# Patient Record
Sex: Female | Born: 1980 | Race: White | Hispanic: No | Marital: Married | Smoking: Never smoker
Health system: Southern US, Community
[De-identification: ages and names within clinical notes are randomized; demographics above are authoritative.]

## PROBLEM LIST (undated history)

## (undated) DIAGNOSIS — O09529 Supervision of elderly multigravida, unspecified trimester: Secondary | ICD-10-CM

## (undated) DIAGNOSIS — Z789 Other specified health status: Secondary | ICD-10-CM

## (undated) DIAGNOSIS — Z87440 Personal history of urinary (tract) infections: Secondary | ICD-10-CM

## (undated) DIAGNOSIS — N979 Female infertility, unspecified: Secondary | ICD-10-CM

## (undated) DIAGNOSIS — Z92241 Personal history of systemic steroid therapy: Secondary | ICD-10-CM

## (undated) DIAGNOSIS — Z8619 Personal history of other infectious and parasitic diseases: Secondary | ICD-10-CM

## (undated) HISTORY — DX: Personal history of systemic steroid therapy: Z92.241

## (undated) HISTORY — DX: Supervision of elderly multigravida, unspecified trimester: O09.529

## (undated) HISTORY — DX: Personal history of other infectious and parasitic diseases: Z86.19

## (undated) HISTORY — DX: Personal history of urinary (tract) infections: Z87.440

## (undated) HISTORY — DX: Female infertility, unspecified: N97.9

## (undated) HISTORY — PX: WISDOM TOOTH EXTRACTION: SHX21

---
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
## 2000-05-20 HISTORY — PX: TONSILLECTOMY: SUR1361

## 2005-05-20 HISTORY — PX: OTHER SURGICAL HISTORY: SHX169

## 2006-01-22 ENCOUNTER — Emergency Department (HOSPITAL_COMMUNITY): Admission: EM | Admit: 2006-01-22 | Discharge: 2006-01-22 | Payer: Self-pay | Admitting: Emergency Medicine

## 2006-01-26 ENCOUNTER — Ambulatory Visit (HOSPITAL_COMMUNITY): Admission: RE | Admit: 2006-01-26 | Discharge: 2006-01-26 | Payer: Self-pay | Admitting: Family Medicine

## 2006-01-26 ENCOUNTER — Emergency Department (HOSPITAL_COMMUNITY): Admission: EM | Admit: 2006-01-26 | Discharge: 2006-01-26 | Payer: Self-pay | Admitting: Family Medicine

## 2006-02-05 ENCOUNTER — Encounter: Admission: RE | Admit: 2006-02-05 | Discharge: 2006-02-05 | Payer: Self-pay | Admitting: Neurosurgery

## 2006-02-26 ENCOUNTER — Other Ambulatory Visit: Admission: RE | Admit: 2006-02-26 | Discharge: 2006-02-26 | Payer: Self-pay | Admitting: Obstetrics and Gynecology

## 2006-02-27 ENCOUNTER — Encounter: Admission: RE | Admit: 2006-02-27 | Discharge: 2006-02-27 | Payer: Self-pay | Admitting: Neurosurgery

## 2006-03-20 ENCOUNTER — Encounter: Admission: RE | Admit: 2006-03-20 | Discharge: 2006-03-20 | Payer: Self-pay | Admitting: Neurosurgery

## 2010-02-09 ENCOUNTER — Ambulatory Visit (HOSPITAL_COMMUNITY): Admission: RE | Admit: 2010-02-09 | Discharge: 2010-02-09 | Payer: Self-pay | Admitting: Obstetrics and Gynecology

## 2010-12-12 LAB — RPR: RPR: NONREACTIVE

## 2010-12-12 LAB — RUBELLA ANTIBODY, IGM: Rubella: IMMUNE

## 2010-12-12 LAB — HIV ANTIBODY (ROUTINE TESTING W REFLEX): HIV: NONREACTIVE

## 2010-12-12 LAB — ABO/RH: RH Type: POSITIVE

## 2010-12-12 LAB — GC/CHLAMYDIA PROBE AMP, GENITAL: Gonorrhea: NEGATIVE

## 2011-05-21 NOTE — L&D Delivery Note (Signed)
Delivery Note At 4:01 PM a viable female was delivered via Vaginal, Spontaneous Delivery, pushing on R side, easy delivery of the head, (Presentation: Left Occiput Anterior), loose nuchal cord x 1 reduced, easy delivery of the shoulders, baby to pt abd, cord doubly clamped and cut by FOB,.  APGAR: 7, 8; weight 7 lb 4.8 oz (3310 g).   Placenta status: Intact, schultze accessory lobe, Spontaneous.  Cord: 3 vessels.  Anesthesia: Epidural  Episiotomy: None Lacerations: None Suture Repair: none Est. Blood Loss (mL):   Mom to postpartum.  Baby to rooming in.  Leafy Motsinger 07/27/2011, 5:56 PM

## 2011-06-21 ENCOUNTER — Inpatient Hospital Stay (HOSPITAL_COMMUNITY): Admission: AD | Admit: 2011-06-21 | Payer: Self-pay | Source: Ambulatory Visit | Admitting: Obstetrics and Gynecology

## 2011-07-16 ENCOUNTER — Encounter: Payer: Commercial Managed Care - PPO | Admitting: Obstetrics and Gynecology

## 2011-07-16 ENCOUNTER — Other Ambulatory Visit (INDEPENDENT_AMBULATORY_CARE_PROVIDER_SITE_OTHER): Payer: Commercial Managed Care - PPO

## 2011-07-16 DIAGNOSIS — O0900 Supervision of pregnancy with history of infertility, unspecified trimester: Secondary | ICD-10-CM

## 2011-07-17 ENCOUNTER — Encounter (HOSPITAL_COMMUNITY): Payer: Self-pay | Admitting: *Deleted

## 2011-07-17 ENCOUNTER — Telehealth (HOSPITAL_COMMUNITY): Payer: Self-pay | Admitting: *Deleted

## 2011-07-17 NOTE — Telephone Encounter (Signed)
Preadmission screen  

## 2011-07-25 ENCOUNTER — Encounter (INDEPENDENT_AMBULATORY_CARE_PROVIDER_SITE_OTHER): Payer: 59 | Admitting: Obstetrics and Gynecology

## 2011-07-25 DIAGNOSIS — O368190 Decreased fetal movements, unspecified trimester, not applicable or unspecified: Secondary | ICD-10-CM

## 2011-07-26 ENCOUNTER — Encounter (HOSPITAL_COMMUNITY): Payer: Self-pay

## 2011-07-26 ENCOUNTER — Inpatient Hospital Stay (HOSPITAL_COMMUNITY)
Admission: RE | Admit: 2011-07-26 | Discharge: 2011-07-29 | DRG: 775 | Disposition: A | Discharge status: Home / Self Care | Payer: 59 | Source: Ambulatory Visit | Attending: Obstetrics and Gynecology | Admitting: Obstetrics and Gynecology

## 2011-07-26 DIAGNOSIS — O480 Post-term pregnancy: Principal | ICD-10-CM | POA: Diagnosis present

## 2011-07-26 HISTORY — DX: Other specified health status: Z78.9

## 2011-07-26 LAB — CBC
HCT: 37.9 % (ref 36.0–46.0)
MCHC: 33.5 g/dL (ref 30.0–36.0)
MCV: 87.1 fL (ref 78.0–100.0)
Platelets: 178 10*3/uL (ref 150–400)
RDW: 13.4 % (ref 11.5–15.5)

## 2011-07-26 MED ORDER — TERBUTALINE SULFATE 1 MG/ML IJ SOLN: 0.2500 mg | Freq: Once | INTRAMUSCULAR | Status: AC | PRN

## 2011-07-26 MED ORDER — LIDOCAINE HCL (PF) 1 % IJ SOLN: 30.0000 mL | INTRAMUSCULAR | Status: DC | PRN

## 2011-07-26 MED ORDER — IBUPROFEN 600 MG PO TABS: 600.0000 mg | ORAL_TABLET | Freq: Four times a day (QID) | ORAL | Status: DC | PRN

## 2011-07-26 MED ORDER — OXYTOCIN BOLUS FROM INFUSION
500.0000 mL | Freq: Once | INTRAVENOUS | Status: DC
  Filled 2011-07-26: qty 500

## 2011-07-26 MED ORDER — ACETAMINOPHEN 325 MG PO TABS: 650.0000 mg | ORAL_TABLET | ORAL | Status: DC | PRN

## 2011-07-26 MED ORDER — OXYTOCIN 20 UNITS IN LACTATED RINGERS INFUSION - SIMPLE: 125.0000 mL/h | Freq: Once | INTRAVENOUS | Status: DC

## 2011-07-26 MED ORDER — CITRIC ACID-SODIUM CITRATE 334-500 MG/5ML PO SOLN: 30.0000 mL | ORAL | Status: DC | PRN

## 2011-07-26 MED ORDER — LACTATED RINGERS IV SOLN: 500.0000 mL | INTRAVENOUS | Status: DC | PRN

## 2011-07-26 MED ORDER — LACTATED RINGERS IV SOLN
INTRAVENOUS | Status: DC
  Administered 2011-07-26 – 2011-07-27 (×3): via INTRAVENOUS

## 2011-07-26 MED ORDER — ONDANSETRON HCL 4 MG/2ML IJ SOLN: 4.0000 mg | Freq: Four times a day (QID) | INTRAMUSCULAR | Status: DC | PRN

## 2011-07-26 MED ORDER — SODIUM CHLORIDE 0.9 % IJ SOLN: 3.0000 mL | Freq: Two times a day (BID) | INTRAMUSCULAR | Status: DC

## 2011-07-26 MED ORDER — OXYCODONE-ACETAMINOPHEN 5-325 MG PO TABS
1.0000 | ORAL_TABLET | ORAL | Status: DC | PRN
Start: 2011-07-26 — End: 2011-07-27

## 2011-07-26 MED ORDER — OXYTOCIN 10 UNIT/ML IJ SOLN: 10.0000 [IU] | Freq: Once | INTRAMUSCULAR | Status: DC

## 2011-07-26 MED ORDER — MISOPROSTOL 25 MCG QUARTER TABLET
25.0000 ug | ORAL_TABLET | ORAL | Status: DC | PRN
  Administered 2011-07-26 – 2011-07-27 (×2): 25 ug via VAGINAL
  Filled 2011-07-26 (×2): qty 0.25

## 2011-07-26 MED ORDER — ZOLPIDEM TARTRATE 10 MG PO TABS
10.0000 mg | ORAL_TABLET | Freq: Every evening | ORAL | Status: DC | PRN
  Administered 2011-07-26: 10 mg via ORAL
  Filled 2011-07-26: qty 1

## 2011-07-27 ENCOUNTER — Inpatient Hospital Stay (HOSPITAL_COMMUNITY): Payer: 59 | Admitting: Anesthesiology

## 2011-07-27 ENCOUNTER — Encounter (HOSPITAL_COMMUNITY): Payer: Self-pay

## 2011-07-27 ENCOUNTER — Encounter (HOSPITAL_COMMUNITY): Payer: Self-pay | Admitting: Anesthesiology

## 2011-07-27 MED ORDER — OXYTOCIN 20 UNITS IN LACTATED RINGERS INFUSION - SIMPLE
1.0000 m[IU]/min | INTRAVENOUS | Status: DC
  Administered 2011-07-27: 1 m[IU]/min via INTRAVENOUS
  Filled 2011-07-27: qty 1000

## 2011-07-27 MED ORDER — BENZOCAINE-MENTHOL 20-0.5 % EX AERO
INHALATION_SPRAY | CUTANEOUS | Status: AC
  Filled 2011-07-27: qty 56

## 2011-07-27 MED ORDER — ZOLPIDEM TARTRATE 5 MG PO TABS: 5.0000 mg | ORAL_TABLET | Freq: Every evening | ORAL | Status: DC | PRN

## 2011-07-27 MED ORDER — SENNOSIDES-DOCUSATE SODIUM 8.6-50 MG PO TABS
2.0000 | ORAL_TABLET | Freq: Every day | ORAL | Status: DC
  Administered 2011-07-27 – 2011-07-28 (×2): 2 via ORAL

## 2011-07-27 MED ORDER — WITCH HAZEL-GLYCERIN EX PADS: 1.0000 "application " | MEDICATED_PAD | CUTANEOUS | Status: DC | PRN

## 2011-07-27 MED ORDER — PHENYLEPHRINE 40 MCG/ML (10ML) SYRINGE FOR IV PUSH (FOR BLOOD PRESSURE SUPPORT)
80.0000 ug | PREFILLED_SYRINGE | INTRAVENOUS | Status: DC | PRN
  Filled 2011-07-27 (×2): qty 5

## 2011-07-27 MED ORDER — EPHEDRINE 5 MG/ML INJ
10.0000 mg | INTRAVENOUS | Status: DC | PRN
  Filled 2011-07-27: qty 4

## 2011-07-27 MED ORDER — FENTANYL 2.5 MCG/ML BUPIVACAINE 1/10 % EPIDURAL INFUSION (WH - ANES)
14.0000 mL/h | INTRAMUSCULAR | Status: DC
  Filled 2011-07-27: qty 60

## 2011-07-27 MED ORDER — BUTORPHANOL TARTRATE 2 MG/ML IJ SOLN: 2.0000 mg | INTRAMUSCULAR | Status: DC | PRN

## 2011-07-27 MED ORDER — LANOLIN HYDROUS EX OINT: TOPICAL_OINTMENT | CUTANEOUS | Status: DC | PRN

## 2011-07-27 MED ORDER — PHENYLEPHRINE 40 MCG/ML (10ML) SYRINGE FOR IV PUSH (FOR BLOOD PRESSURE SUPPORT): 80.0000 ug | PREFILLED_SYRINGE | INTRAVENOUS | Status: DC | PRN

## 2011-07-27 MED ORDER — DIPHENHYDRAMINE HCL 50 MG/ML IJ SOLN: 12.5000 mg | INTRAMUSCULAR | Status: DC | PRN

## 2011-07-27 MED ORDER — CALCIUM CARBONATE ANTACID 500 MG PO CHEW: 2.0000 | CHEWABLE_TABLET | ORAL | Status: DC | PRN

## 2011-07-27 MED ORDER — DIPHENHYDRAMINE HCL 25 MG PO CAPS: 25.0000 mg | ORAL_CAPSULE | Freq: Four times a day (QID) | ORAL | Status: DC | PRN

## 2011-07-27 MED ORDER — ONDANSETRON HCL 4 MG/2ML IJ SOLN: 4.0000 mg | INTRAMUSCULAR | Status: DC | PRN

## 2011-07-27 MED ORDER — TERBUTALINE SULFATE 1 MG/ML IJ SOLN: 0.2500 mg | Freq: Once | INTRAMUSCULAR | Status: DC | PRN

## 2011-07-27 MED ORDER — SODIUM BICARBONATE 8.4 % IV SOLN
INTRAVENOUS | Status: DC | PRN
  Administered 2011-07-27: 5 mL via EPIDURAL

## 2011-07-27 MED ORDER — SIMETHICONE 80 MG PO CHEW: 80.0000 mg | CHEWABLE_TABLET | ORAL | Status: DC | PRN

## 2011-07-27 MED ORDER — DIBUCAINE 1 % RE OINT: 1.0000 "application " | TOPICAL_OINTMENT | RECTAL | Status: DC | PRN

## 2011-07-27 MED ORDER — FENTANYL 2.5 MCG/ML BUPIVACAINE 1/10 % EPIDURAL INFUSION (WH - ANES)
INTRAMUSCULAR | Status: DC | PRN
  Administered 2011-07-27: 14 mL/h via EPIDURAL

## 2011-07-27 MED ORDER — OXYCODONE-ACETAMINOPHEN 5-325 MG PO TABS: 1.0000 | ORAL_TABLET | ORAL | Status: DC | PRN

## 2011-07-27 MED ORDER — BENZOCAINE-MENTHOL 20-0.5 % EX AERO: 1.0000 "application " | INHALATION_SPRAY | CUTANEOUS | Status: DC | PRN

## 2011-07-27 MED ORDER — PROMETHAZINE HCL 25 MG/ML IJ SOLN: 12.5000 mg | INTRAMUSCULAR | Status: DC | PRN

## 2011-07-27 MED ORDER — EPHEDRINE 5 MG/ML INJ: 10.0000 mg | INTRAVENOUS | Status: DC | PRN

## 2011-07-27 MED ORDER — LACTATED RINGERS IV SOLN: 500.0000 mL | Freq: Once | INTRAVENOUS | Status: DC

## 2011-07-27 MED ORDER — IBUPROFEN 600 MG PO TABS
600.0000 mg | ORAL_TABLET | Freq: Four times a day (QID) | ORAL | Status: DC
  Administered 2011-07-27 – 2011-07-29 (×8): 600 mg via ORAL
  Filled 2011-07-27 (×8): qty 1

## 2011-07-27 MED ORDER — TETANUS-DIPHTH-ACELL PERTUSSIS 5-2.5-18.5 LF-MCG/0.5 IM SUSP: 0.5000 mL | Freq: Once | INTRAMUSCULAR | Status: DC

## 2011-07-27 MED ORDER — PRENATAL MULTIVITAMIN CH
1.0000 | ORAL_TABLET | Freq: Every day | ORAL | Status: DC
  Administered 2011-07-28 – 2011-07-29 (×2): 1 via ORAL
  Filled 2011-07-27: qty 1

## 2011-07-27 MED ORDER — ONDANSETRON HCL 4 MG PO TABS: 4.0000 mg | ORAL_TABLET | ORAL | Status: DC | PRN

## 2011-07-27 NOTE — Progress Notes (Addendum)
Patient ID: Ann Roberts, female   DOB: 24-Mar-1981, 31 y.o.   MRN: [id number] Slept some, agrees to foley bulb and pitocin O VSS      Fhts 140s LTV min to mod      uc q 2 mild      abd soft between uc     Vag 1 60 -2 VTX I A 41 week induction P continue care, discussed options for pain relief, Lavera Guise, CNM Addendum: Foley bulb placed easily, plan pitocin when able, Dr. Estanislado Pandy updated at 0830, [facility]

## 2011-07-27 NOTE — Progress Notes (Signed)
Dr. Estanislado Pandy updated on pt status per telephone, continue care. Lavera Guise, CNM

## 2011-07-27 NOTE — H&P (Signed)
Ann Roberts is a 31 y.o. female presenting for IOL secondary to PD. PT denies ctx, VB, LOF, GFM. Pregnancy significant for: 1. 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 Persistent glucosuria - nl GDM testing  HPI: pt began [facility] at [facility] at 10wks. She had normal 1st trimester screen, and AFP. Normal anatomy scan. She a normal 1hr gtt at 19wks and a repeat at 28wks that was normal. She had persistent glucosuria, with normal CBG's. [place] at 39wks EFW 7-15. GBS at 36wks was neg.   Maternal Medical History:  Reason for admission: IOL for PD  Fetal activity: Perceived fetal activity is normal.   Last perceived fetal movement was within the past hour.    Prenatal complications: no prenatal complications   OB History    Grav Para Term Preterm Abortions TAB SAB Ect Mult Living   1              Past Medical History  Diagnosis Date  . Infertility, female   . History of cystitis   . No pertinent past medical history    Past Surgical History  Procedure Date  . Anterior cruciate ligament repair 1996    R  . Tonsillectomy   . Wisdom tooth extraction   . Epidural steroids     x2 for ruptured disc in back  . Tonsilectomy, adenoidectomy, bilateral myringotomy and tubes    Family History: family history includes Cancer in her maternal grandfather; Dementia in her paternal grandmother; Diabetes in her maternal grandmother; Factor V Leiden deficiency in her paternal aunt; Heart attack in her maternal grandmother; Hypertension in her paternal aunt; and Migraines in her father. Social History:  reports that she has never smoked. She has never used smokeless tobacco. She reports that she does not drink alcohol or use illicit drugs.  Review of Systems  All other systems reviewed and are negative.    Dilation: 1 Effacement (%): 50 Station: -2 Exam by:: Cammy Copa, RN Blood pressure 115/67, pulse 89, temperature 98.3 F (36.8 C), temperature source Oral, resp. rate 16,  height 5\' 8"  (1.727 m), weight 85.73 kg (189 lb), last menstrual period 10/12/2010, SpO2 96.00%. Maternal Exam:  Uterine Assessment: Contraction strength is mild.  Contraction frequency is rare.   Abdomen: Patient reports no abdominal tenderness. Fundal height is aga.   Estimated fetal weight is 8#.   Fetal presentation: vertex  Introitus: Normal vulva. Normal vagina.  Vagina is negative for discharge.  Pelvis: adequate for delivery.   Cervix: Cervix evaluated by digital exam.     Fetal Exam Fetal Monitor Review: Mode: ultrasound.   Baseline rate: 140.  Variability: moderate (6-25 bpm).   Pattern: accelerations present and no decelerations.    Fetal State Assessment: Category I - tracings are normal.     Physical Exam  Nursing note and vitals reviewed. Constitutional: She is oriented to person, place, and time. She appears well-developed and well-nourished. No distress.  HENT:  Head: Normocephalic.  Neck: Normal range of motion.  Cardiovascular: Normal rate, regular rhythm and normal heart sounds.   Respiratory: Effort normal and breath sounds normal.  GI: Soft.  Genitourinary: Vagina normal. No vaginal discharge found.  Musculoskeletal: Normal range of motion. She exhibits no edema.  Neurological: She is alert and oriented to person, place, and time. She has normal reflexes.  Skin: Skin is warm and dry.  Psychiatric: She has a normal mood and affect. Her behavior is normal.    Prenatal labs: ABO, Rh:  O/Positive/-- (07/25 0000) Antibody: Negative (07/25 0000) Rubella: Immune (07/25 0000) RPR: NON REACTIVE (03/08 2240)  HBsAg: Negative (07/25 0000)  HIV: Non-reactive (07/25 0000)  GBS: Negative (01/31 0000)  GC/CT neg Normal 1st trimester screen and AFP Normal early 1hr gtt and 28wk gtt  Assessment/Plan: IUP at 41wks GBS neg FHR reassuring Desires IOL  Admit to birthing suites Dr Estanislado Pandy attending Routine labor orders cytotec for cervical ripening Will  evaluate for pitocin in the AM   Lurline Caver M 07/27/2011, 4:30 AM

## 2011-07-27 NOTE — Anesthesia Postprocedure Evaluation (Signed)
  Anesthesia Post-op Note  Patient: Ann Roberts  Procedure(s) Performed: * No procedures listed *  Patient Location: PACU and Mother/Baby  Anesthesia Type: Epidural  Level of Consciousness: awake, alert  and oriented  Airway and Oxygen Therapy: Patient Spontanous Breathing   Post-op Assessment: Patient's Cardiovascular Status Stable and Respiratory Function Stable  Post-op Vital Signs: stable  Complications: No apparent anesthesia complications

## 2011-07-27 NOTE — Progress Notes (Signed)
Breathing well with uc O Fhts 130s LTV mod accels     uc a 1-3 mild     Vag not assessed     IV pit at 3 A 41 week induction P continue care Lavera Guise, CNM

## 2011-07-27 NOTE — Anesthesia Preprocedure Evaluation (Signed)

## 2011-07-27 NOTE — Progress Notes (Signed)
Tense with uc, ready for epidural O VSS     Fhts 130s LTV min variables with rapid recovery     uc q 2-4 mod     abd soft between uc     Vag 6 100 -1/- VTX R clear fluid A active labor P continue care, epidural now Lavera Guise, CNM

## 2011-07-27 NOTE — Anesthesia Procedure Notes (Signed)

## 2011-07-28 LAB — CBC
HCT: 33.8 % — ABNORMAL LOW (ref 36.0–46.0)
Hemoglobin: 11.3 g/dL — ABNORMAL LOW (ref 12.0–15.0)
MCHC: 33.4 g/dL (ref 30.0–36.0)
MCV: 88.7 fL (ref 78.0–100.0)
RDW: 13.1 % (ref 11.5–15.5)
WBC: 14 10*3/uL — ABNORMAL HIGH (ref 4.0–10.5)

## 2011-07-28 NOTE — Progress Notes (Signed)
Comfortable, no sleep, no pain, breastfed well this am, little bleeding O VSS     Ff sm rubra flow perineum clearn, intact     No edema, - Homan-s sign bilaterally, no edema A pp day 1 lactating, normal involution P continue care Lavera Guise, CNM

## 2011-07-29 MED ORDER — NORETHINDRONE 0.35 MG PO TABS: 1.0000 | ORAL_TABLET | Freq: Every day | ORAL | Status: DC

## 2011-07-29 MED ORDER — IBUPROFEN 600 MG PO TABS: 600.0000 mg | ORAL_TABLET | Freq: Four times a day (QID) | ORAL | Status: AC | PRN

## 2011-07-29 NOTE — Discharge Instructions (Signed)
Vaginal Delivery Care After  Change your pad on each trip to the bathroom.   Wipe gently with toilet paper during your hospital stay. Always wipe from front to back. A spray bottle with warm tap water could also be used or a towelette if available.   Place your soiled pad and toilet paper in a bathroom wastebasket with a plastic bag liner.   During your hospital stay, save any clots. If you pass a clot while on the toilet, do not flush it. Also, if your vaginal flow seems excessive to you, notify nursing personnel.   The first time you get out of bed after delivery, wait for assistance from a nurse. Do not get up alone at any time if you feel weak or dizzy.   Bend and extend your ankles forcefully so that you feel the calves of your legs get hard. Do this 6 times every hour when you are in bed and awake.   Do not sit with one foot under you, dangle your legs over the edge of the bed, or maintain a position that hinders the circulation in your legs.   Many women experience after pains for 2 to 3 days after delivery. These after pains are mild uterine contractions. Ask the nurse for a pain medication if you need something for this. Sometimes breastfeeding stimulates after pains; if you find this to be true, ask for the medication  -  hour before the next feeding.   For you and your infant's protection, do not go beyond the door(s) of the obstetric unit. Do not carry your baby in your arms in the hallway. When taking your baby to and from your room, put your baby in the bassinet and push the bassinet.   Mothers may have their babies in their room as much as they desire.   Breastfeeding BENEFITS OF BREASTFEEDING For the baby  The first milk (colostrum) helps the baby's digestive system function better.   There are antibodies from the mother in the milk that help the baby fight off infections.   The baby has a lower incidence of asthma, allergies, and SIDS (sudden infant death syndrome).     The nutrients in breast milk are better than formulas for the baby and helps the baby's brain grow better.   Babies who breastfeed have less gas, colic, and constipation.  For the mother  Breastfeeding helps develop a very special bond between mother and baby.   It is more convenient, always available at the correct temperature and cheaper than formula feeding.   It burns calories in the mother and helps with losing weight that was gained during pregnancy.   It makes the uterus contract back down to normal size faster and slows bleeding following delivery.   Breastfeeding mothers have a lower risk of developing breast cancer.  NURSE FREQUENTLY  A healthy, full-term baby may breastfeed as often as every hour or space his or her feedings to every 3 hours.   How often to nurse will vary from baby to baby. Watch your baby for signs of hunger, not the clock.   Nurse as often as the baby requests, or when you feel the need to reduce the fullness of your breasts.   Awaken the baby if it has been 3 to 4 hours since the last feeding.   Frequent feeding will help the mother make more milk and will prevent problems like sore nipples and engorgement of the breasts.  BABY'S POSITION AT THE BREAST    Whether lying down or sitting, be sure that the baby's tummy is facing your tummy.   Support the breast with 4 fingers underneath the breast and the thumb above. Make sure your fingers are well away from the nipple and baby's mouth.   Stroke the baby's lips and cheek closest to the breast gently with your finger or nipple.   When the baby's mouth is open wide enough, place all of your nipple and as much of the dark area around the nipple as possible into your baby's mouth.   Pull the baby in close so the tip of the nose and the baby's cheeks touch the breast during the feeding.  FEEDINGS  The length of each feeding varies from baby to baby and from feeding to feeding.   The baby must suck  about 2 to 3 minutes for your milk to get to him or her. This is called a "let down." For this reason, allow the baby to feed on each breast as long as he or she wants. Your baby will end the feeding when he or she has received the right balance of nutrients.   To break the suction, put your finger into the corner of the baby's mouth and slide it between his or her gums before removing your breast from his or her mouth. This will help prevent sore nipples.  REDUCING BREAST ENGORGEMENT  In the first week after your baby is born, you may experience signs of breast engorgement. When breasts are engorged, they feel heavy, warm, full, and may be tender to the touch. You can reduce engorgement if you:   Nurse frequently, every 2 to 3 hours. Mothers who breastfeed early and often have fewer problems with engorgement.   Place light ice packs on your breasts between feedings. This reduces swelling. Wrap the ice packs in a lightweight towel to protect your skin.   Apply moist hot packs to your breast for 5 to 10 minutes before each feeding. This increases circulation and helps the milk flow.   Gently massage your breast before and during the feeding.   Make sure that the baby empties at least one breast at every feeding before switching sides.   Use a breast pump to empty the breasts if your baby is sleepy or not nursing well. You may also want to pump if you are returning to work or or you feel you are getting engorged.   Avoid bottle feeds, pacifiers or supplemental feedings of water or juice in place of breastfeeding.   Be sure the baby is latched on and positioned properly while breastfeeding.   Prevent fatigue, stress, and anemia.   Wear a supportive bra, avoiding underwire styles.   Eat a balanced diet with enough fluids.  If you follow these suggestions, your engorgement should improve in 24 to 48 hours. If you are still experiencing difficulty, call your lactation consultant or  caregiver. IS MY BABY GETTING ENOUGH MILK? Sometimes, mothers worry about whether their babies are getting enough milk. You can be assured that your baby is getting enough milk if:  The baby is actively sucking and you hear swallowing.   The baby nurses at least 8 to 12 times in a 24 hour time period. Nurse your baby until he or she unlatches or falls asleep at the first breast (at least 10 to 20 minutes), then offer the second side.   The baby is wetting 5 to 6 disposable diapers (6 to 8 cloth diapers) in a   24 hour period by 5 to 6 days of age.   The baby is having at least 2 to 3 stools every 24 hours for the first few months. Breast milk is all the food your baby needs. It is not necessary for your baby to have water or formula. In fact, to help your breasts make more milk, it is best not to give your baby supplemental feedings during the early weeks.   The stool should be soft and yellow.   The baby should gain 4 to 7 ounces per week after he is 4 days old.  TAKE CARE OF YOURSELF Take care of your breasts by:  Bathing or showering daily.   Avoiding the use of soaps on your nipples.   Start feedings on your left breast at one feeding and on your right breast at the next feeding.   You will notice an increase in your milk supply 2 to 5 days after delivery. You may feel some discomfort from engorgement, which makes your breasts very firm and often tender. Engorgement "peaks" out within 24 to 48 hours. In the meantime, apply warm moist towels to your breasts for 5 to 10 minutes before feeding. Gentle massage and expression of some milk before feeding will soften your breasts, making it easier for your baby to latch on. Wear a well fitting nursing bra and air dry your nipples for 10 to 15 minutes after each feeding.   Only use cotton bra pads.   Only use pure lanolin on your nipples after nursing. You do not need to wash it off before nursing.  Take care of yourself by:   Eating  well-balanced meals and nutritious snacks.   Drinking milk, fruit juice, and water to satisfy your thirst (about 8 glasses a day).   Getting plenty of rest.   Increasing calcium in your diet (1200 mg a day).   Avoiding foods that you notice affect the baby in a bad way.  SEEK MEDICAL CARE IF:   You have any questions or difficulty with breastfeeding.   You need help.   You have a hard, red, sore area on your breast, accompanied by a fever of 100.5 F (38.1 C) or more.   Your baby is too sleepy to eat well or is having trouble sleeping.   Your baby is wetting less than 6 diapers per day, by 5 days of age.   Your baby's skin or white part of his or her eyes is more yellow than it was in the hospital.   You feel depressed.  Document Released: 05/06/2005 Document Revised: 04/25/2011 Document Reviewed: 12/19/2008 ExitCare Patient Information 2012 [employer]. 

## 2011-07-29 NOTE — Discharge Summary (Signed)
  Obstetric Discharge Summary Reason for Admission: induction of labor Prenatal Procedures: ultrasound Intrapartum Procedures: spontaneous vaginal delivery Postpartum Procedures: none Complications-Operative and Postpartum: none  Temp:  [98.3 F (36.8 C)-98.5 F (36.9 C)] 98.3 F (36.8 C) (03/11 0621) Pulse Rate:  [73-81] 81  (03/11 0621) Resp:  [18] 18  (03/11 0621) BP: (113-138)/(75-86) 113/75 mmHg (03/11 0621) Hemoglobin  Date Value Range Status  07/28/2011 11.3* 12.0-15.0 (g/dL) Final     HCT  Date Value Range Status  07/28/2011 33.8* 36.0-46.0 (%) Final   S: Doing well. Denies complaints. Breastfeeding going well. Lactation in to assist. Desires Micronor as BCM   O: VSS  Assessment/Plan General: alert and oriented Lochia: scant rubra, Fundus firm Homan's sign negative- No signs of DVT Advised PP exam appt. X 6 weeks (has appt.) Begin Micronor 4 weeks on 08/25/2011   Hospital Course:  Hospital Course: Admitted in for induction of labor for postdates. Negative GBS. Progressed to fully dilated. Delivery was performed by Maryln Manuel, CNM without difficulty. Patient and baby tolerated the procedure without difficulty, without a laceration noted. Infant to FTN. Mother and infant then had an uncomplicated postpartum course, with  breastfeeding going well. Mom's physical exam was WNL, and she was discharged home in stable condition. Contraception plan was Micronor.  She received adequate benefit from po pain medications.  Discharge Diagnoses: Post-date pregnancy  Discharge Information: Date: 07/29/2011 Activity: nothing in vagina x 6 weeks Diet: routine Medications:  Medication List  As of 07/29/2011  8:57 AM   ASK your doctor about these medications         calcium carbonate 500 MG chewable tablet   Commonly known as: TUMS - dosed in mg elemental calcium      prenatal multivitamin Tabs           Condition: stable Instructions: refer to practice specific  booklet Discharge to: home   Newborn Data: Live born  Information for the patient's newborn:  Rayah, Fines [[id number]]  female ; APGAR 7,8 ; weight 7 lb. 4.8 oz.    Kizzie Fantasia CORI 07/29/2011, 8:57 AM   Agree with above - AYR

## 2011-07-31 ENCOUNTER — Encounter (HOSPITAL_COMMUNITY): Payer: 59

## 2011-09-06 ENCOUNTER — Encounter: Payer: Self-pay | Admitting: Obstetrics and Gynecology

## 2011-09-06 ENCOUNTER — Ambulatory Visit (INDEPENDENT_AMBULATORY_CARE_PROVIDER_SITE_OTHER): Payer: 59 | Admitting: Obstetrics and Gynecology

## 2011-09-06 NOTE — Progress Notes (Signed)
Ann Roberts  is 6 weeks postpartum following a spontaneous vaginal delivery at 23 gestational weeks Date: 08/03/11 female baby named Ann Roberts deceased at 2 days, cause unknown.    Post-partum blues / depression:  no   History of abnormal Pap:  no  Last Pap: Date  Sept 2012 Gestational diabetes:  no  Contraception:  Desires no method  Normal urinary function:  yes Normal GI function:  yes Returning to work:  Yes 09/09/11  Subjective:     Ann Roberts is a 31 y.o. female who presents for a postpartum visit. Nolan died at 5 days of age. Grieving appropriately. Declines meds. Awaiting autopsy. I have fully reviewed the prenatal and intrapartum course.    Patient is sexually active.   The following portions of the patient's history were reviewed and updated as appropriate: allergies, current medications, past family history, past medical history, past social history, past surgical history and problem list.  Review of Systems Pertinent items are noted in HPI.   Objective:    BP 110/82  Wt 167 lb (75.751 kg)  Breastfeeding? No  General:  alert, cooperative and no distress     Lungs: clear to auscultation bilaterally  Heart:  regular rate and rhythm, S1, S2 normal, no murmur  Abdomen: soft, non-tender; bowel sounds normal; no masses,  no organomegaly   Vulva:  normal  Vagina: normal vagina  Cervix:  normal  Corpus: normal size, contour, position, consistency, mobility, non-tender  Adnexa:  normal adnexa             Assessment:     Normal postpartum exam.  Unexplained neonatal death Pap smear not done at today's visit.   Plan:     1. Contraception: none 3. Follow up in: 5 months  AEX or as needed.    Reizel Calzada A MD 09/06/2011 1:32 PM

## 2011-09-16 ENCOUNTER — Telehealth: Payer: Self-pay | Admitting: Obstetrics and Gynecology

## 2011-09-16 NOTE — Telephone Encounter (Signed)
Routed to Laura/Dr. Rivard/3rd call

## 2011-09-17 ENCOUNTER — Telehealth: Payer: Self-pay | Admitting: Obstetrics and Gynecology

## 2011-09-17 NOTE — Telephone Encounter (Signed)
TC to [place] at Dr [employer] office. Next available appointment is 03/10/12 at 9:30.  Dr SR will be made aware.

## 2011-09-19 NOTE — Telephone Encounter (Signed)
TC to pt. Informed of appt for genetic counselor.  Phone number given to check on cancellation for earlier appt.   Pt states will research other genetic counseling possibilities and call if desires referral to different provider.

## 2011-09-19 NOTE — Telephone Encounter (Signed)
Is there an earlier option? If so, please schedule

## 2011-10-07 NOTE — Telephone Encounter (Signed)
Pt calling to ask if spotting or irregular bleeding is normal at 10w pp.  Explained to pt that it can take several cycles to return to normal.  Instructed pt to call if any other questions or concerns. Pt agreeable.  ld

## 2011-10-07 NOTE — Telephone Encounter (Signed)
SR PT 

## 2011-11-01 ENCOUNTER — Telehealth: Payer: Self-pay | Admitting: Obstetrics and Gynecology

## 2011-11-01 NOTE — Telephone Encounter (Signed)
Spoke with pt rgd msg pt left. Pt stated that she delivered her baby on August 16, 2011 baby passed away 2 days later. Pt stated that she been having her cycle every 2 weeks started 09/20/2011 about for 1 week and heavy ,10/04/2016 about 4 days, 10/18/2011 4 days and 10/31/2011 started again. Asked pt is she been having some stress pt stated she been going through some stress with work and still grieving from the loss of her baby nolan . Offered pt app with SR on 11/14/2011.pt accepted app to see sr. Pt. Voices understanding [facility]

## 2011-11-01 NOTE — Telephone Encounter (Deleted)
Spoke with pt rgd msg. Pt stated that she

## 2011-11-01 NOTE — Telephone Encounter (Signed)
Triage/cht received 

## 2011-11-14 ENCOUNTER — Encounter: Payer: Self-pay | Admitting: Obstetrics and Gynecology

## 2011-11-14 ENCOUNTER — Ambulatory Visit (INDEPENDENT_AMBULATORY_CARE_PROVIDER_SITE_OTHER): Payer: 59 | Admitting: Obstetrics and Gynecology

## 2011-11-14 VITALS — BP 116/74 | Ht 67.0 in | Wt 159.0 lb

## 2011-11-14 DIAGNOSIS — N926 Irregular menstruation, unspecified: Secondary | ICD-10-CM

## 2011-11-14 LAB — POCT URINALYSIS DIPSTICK
Glucose, UA: NEGATIVE
Spec Grav, UA: 1.01
Urobilinogen, UA: NEGATIVE

## 2011-11-14 LAB — THYROID PANEL WITH TSH
Free Thyroxine Index: 3.5 (ref 1.0–3.9)
T4, Total: 9.8 ug/dL (ref 5.0–12.5)
TSH: 0.917 u[IU]/mL (ref 0.350–4.500)

## 2011-11-14 LAB — FOLLICLE STIMULATING HORMONE: FSH: 9.4 m[IU]/mL

## 2011-11-14 MED ORDER — NORETHINDRONE ACETATE 5 MG PO TABS: 5.0000 mg | ORAL_TABLET | Freq: Every day | ORAL | Status: DC

## 2011-11-14 NOTE — Progress Notes (Signed)
  Current contraception: none. Hormone replacement therapy: No New medication: No  History of JWJ:[id number]  History of infertility: yes - . History of abnormal Pap smear: no History of fibroids: No  Increased stress: No  Abnormal bleeding pattern started: pt states cycles comes every two week since May 3,2013 c/o heavy bleeding first couple of days then lightens up with headaches and  some cramping pt states bowel movents not normal every other day  pt had cycle 09/20/11, 10/05/11,10/18/11,10/31/11,11/13/11  Subjective:     Ann Roberts is a 31 y.o. woman,G1P1001, who presents for irregular menses.  Cycle every 14 days,lasting 5-6 days, flow was heavier but now improving, no dysmenorrhea. Increased headaches with occasional visual disturbances.No new meds. No history of thyroid disorder. Prior had cycle every 45-60 days.   Denies any urinary tract symptoms, changes in bowel movements, nausea, vomiting or fever.   Current contraception: none.   Menstrual History: OB History    Grav Para Term Preterm Abortions TAB SAB Ect Mult Living   1 1 1       1        Patient's last menstrual period was 11/13/2011.    The following portions of the patient's history were reviewed and updated as appropriate: allergies, current medications, past family history, past medical history, past social history and past surgical history.  Review of Systems Pertinent items are noted in HPI.    Objective:    BP 116/74  Ht 5\' 7"  (1.702 m)  Wt 159 lb (72.122 kg)  BMI 24.90 kg/m2  LMP 11/13/2011  Breastfeeding? Unknown  Weight:  Wt Readings from Last 1 Encounters:  11/14/11 159 lb (72.122 kg)    BMI: Body mass index is 24.90 kg/(m^2).  General Appearance: Alert, appropriate appearance for age. No acute distress HEENT: Grossly normal Neck / Thyroid: Supple, no masses, nodes or enlargement Lungs: clear to auscultation bilaterally Back: No CVA tenderness Cardiovascular: Regular rate and rhythm. S1,  S2, no murmur Gastrointestinal: Soft, non-tender, no masses or organomegaly Pelvic Exam: Vulva and vagina appear normal. Bimanual exam reveals normal uterus and adnexa. Rectovaginal: not indicated      Assessment:   Dysfunctional uterine bleeding Menorrhagia    Plan:   TSH,PRL,FSH,SONO  Aygestin daily until 14 days without bleeding   Kymorah Korf A  MD 11/14/2011 4:47 PM

## 2011-11-25 ENCOUNTER — Other Ambulatory Visit: Payer: 59

## 2011-11-27 ENCOUNTER — Encounter: Payer: Self-pay | Admitting: Obstetrics and Gynecology

## 2011-11-27 ENCOUNTER — Other Ambulatory Visit: Payer: Self-pay | Admitting: Obstetrics and Gynecology

## 2011-11-27 ENCOUNTER — Ambulatory Visit (INDEPENDENT_AMBULATORY_CARE_PROVIDER_SITE_OTHER): Payer: 59 | Admitting: Obstetrics and Gynecology

## 2011-11-27 ENCOUNTER — Ambulatory Visit (INDEPENDENT_AMBULATORY_CARE_PROVIDER_SITE_OTHER): Payer: 59

## 2011-11-27 VITALS — BP 118/74 | Wt 155.0 lb

## 2011-11-27 DIAGNOSIS — N949 Unspecified condition associated with female genital organs and menstrual cycle: Secondary | ICD-10-CM

## 2011-11-27 DIAGNOSIS — N938 Other specified abnormal uterine and vaginal bleeding: Secondary | ICD-10-CM

## 2011-11-27 DIAGNOSIS — N926 Irregular menstruation, unspecified: Secondary | ICD-10-CM

## 2011-11-27 NOTE — Progress Notes (Signed)
Subjective: Follow -up u/s today. Pt is still having spotting from last visit . Aygestin 5 mg started 11/15/11 which has decreased the bleeding but still has to wear a pantyliner Would like to start trying conceiving in October  Objective:  TSH, FSH and Prolactin normal  Sono:  Anteverted uterus - WNLs  Thin endometrium.  Normal cavity shape by 3D images.  Normal ovaries/adnexa.  No free fluid.  ROV  7.13cm  LOV  6.17cm   Assessment:  Unexplained DUB  Plan:  Pt to stop Aygestin and observe. Pt prefers not to start BCP

## 2012-02-03 ENCOUNTER — Other Ambulatory Visit: Payer: Self-pay

## 2012-02-03 MED ORDER — PRENATAL MULTIVITAMIN CH: 1.0000 | ORAL_TABLET | Freq: Every day | ORAL | Status: DC

## 2012-02-03 NOTE — Telephone Encounter (Signed)
Refill request faxed in  ld 

## 2012-02-18 ENCOUNTER — Telehealth: Payer: Self-pay

## 2012-02-18 NOTE — Telephone Encounter (Signed)
Genetics counselor calling for info.  Will consult SR.  ld

## 2012-03-05 ENCOUNTER — Encounter: Payer: 59 | Admitting: Obstetrics and Gynecology

## 2012-03-06 ENCOUNTER — Encounter: Payer: Self-pay | Admitting: Obstetrics and Gynecology

## 2012-03-06 ENCOUNTER — Ambulatory Visit: Payer: 59 | Admitting: Obstetrics and Gynecology

## 2012-03-06 ENCOUNTER — Ambulatory Visit (INDEPENDENT_AMBULATORY_CARE_PROVIDER_SITE_OTHER): Payer: 59 | Admitting: Obstetrics and Gynecology

## 2012-03-06 VITALS — BP 112/64 | Ht 67.5 in | Wt 155.0 lb

## 2012-03-06 DIAGNOSIS — O09299 Supervision of pregnancy with other poor reproductive or obstetric history, unspecified trimester: Secondary | ICD-10-CM

## 2012-03-06 DIAGNOSIS — Z124 Encounter for screening for malignant neoplasm of cervix: Secondary | ICD-10-CM

## 2012-03-06 NOTE — Progress Notes (Signed)
The patient reports:no complaints. Pt does that she had IUI done on Wed.   Contraception:no method  Last mammogram: not applicable  Last pap: approximate date 03/2011 and was normal  Pt was concerned about billing issues with pap not being 1 year apart. Spoke with Energy manager and was advised that with pt ins must be one calender year.   GC/Chlamydia cultures offered: Declined HIV/RPR/HbsAg offered:  declined HSV 1 and 2 glycoprotein offered: declined  Menstrual cycle regular and monthly: No:  Menstrual flow normal: No:   Urinary symptoms: none Normal bowel movements: Yes  Reports abuse at home: No:

## 2012-03-06 NOTE — Progress Notes (Signed)
Subjective:    Ann Roberts is a 31 y.o. female, G1P1000, who presents for an annual exam.   Hx remarkable for neonatal death of newborn son, Ann Roberts, 2 days after birth, in March 2013.  No etiology has been determined yet--ME who did autopsy no longer works in [facility], so results have been delayed.    Patient desires another pregnancy--had IUI on Tuesday this week through Dr. Floyce Stakes office.  Will f/u with them in 2 weeks if + UTP.  If negative, will do another IUI course.  Patient reports overall doing well--has strong faith and hope for the future, and is coping with loss of son.    History   Social History  . Marital Status: Married    Spouse Name: N/A    Number of Children: N/A  . Years of Education: N/A   Social History Main Topics  . Smoking status: Never Smoker   . Smokeless tobacco: Never Used  . Alcohol Use: No  . Drug Use: No  . Sexually Active: Yes    Birth Control/ Protection: Pill   Other Topics Concern  . None   Social History Narrative  . None    Menstrual cycle:   LMP: Patient's last menstrual period was 02/23/2012.           Cycle: Normal   The following portions of the patient's history were reviewed and updated as appropriate: allergies, current medications, past family history, past medical history, past social history, past surgical history and problem list.  Review of Systems Pertinent items are noted in HPI. Breast:Negative for breast lump,nipple discharge or nipple retraction Gastrointestinal: Negative for abdominal pain, change in bowel habits or rectal bleeding Urinary:negative   Objective:    BP 112/64  Ht 5' 7.5" (1.715 m)  Wt 155 lb (70.308 kg)  BMI 23.92 kg/m2  LMP 02/23/2012    Weight:  Wt Readings from Last 1 Encounters:  03/06/12 155 lb (70.308 kg)          BMI: Body mass index is 23.92 kg/(m^2).  General Appearance: Alert, appropriate appearance for age. No acute distress HEENT: Grossly normal Neck / Thyroid:  Supple, no masses, nodes or enlargement Lungs: clear to auscultation bilaterally Back: No CVA tenderness Breast Exam: No masses or nodes.No dimpling, nipple retraction or discharge. Cardiovascular: Regular rate and rhythm. S1, S2, no murmur Gastrointestinal: Soft, non-tender, no masses or organomegaly Pelvic Exam: Vulva and vagina appear normal. Bimanual exam reveals normal uterus and adnexa. Rectovaginal: normal rectal, no masses Lymphatic Exam: Non-palpable nodes in neck, clavicular, axillary, or inguinal regions Skin: no rash or abnormalities Neurologic: Normal gait and speech, no tremor  Psychiatric: Alert and oriented, appropriate affect.   Wet Prep:not applicable Urinalysis:not applicable UPT: Not done   Assessment:    Normal gyn exam    Plan:    Mammogram: Age 82 Pap:  Done today STD screening: declined Contraception:no method Other:  Support to patient for loss.  She will f/u with [place] with conception. Continue PNV.  Discussed no definitive plan for antenatal testing or other guidelines for future pregnancy s/p neonatal loss.  Patient may want to stay an extra day in the hospital due to hx of loss on day 2 of life.      Nyra Capes, [place]

## 2012-03-10 ENCOUNTER — Ambulatory Visit: Payer: 59 | Admitting: Pediatrics

## 2012-03-10 LAB — PAP IG W/ RFLX HPV ASCU

## 2012-05-20 NOTE — L&D Delivery Note (Signed)
Delivery Note  Approximately 30 minutes after receiving epidural, pt began to feel more pressure and urge to push, cervix was complete at about 0130, AROM for lg amt clear fluid and pt began pushing, FHR remained reassuring  At 1:40 AM a viable female was delivered via Vaginal, Spontaneous Delivery (Presentation: Left Occiput Anterior).  Shoulders delivered easily, infant w immediate lusty cry and dried and  placed on mom's abdomen, cord doubly clamped and cut by FOB, APGAR: 8, 9; weight: pending  Placenta status: Intact, Spontaneous.  To pathology for prior hx of neonatal death due to neonatal hepatitis, Cord: 3 vessels with the following complications: None.  Cord pH: n/a  Anesthesia: Epidural  Episiotomy: None Lacerations: None Suture Repair: n/a Est. Blood Loss (mL): 200  Mom to postpartum.  Baby to nursery-stable. Infant remains skin-skin PT plans to BF Routine PP orders Faculty pediatric team to be made aware of hx of neonatal death  Jay Haskew M 2013-03-14, 2:47 AM

## 2012-07-22 ENCOUNTER — Other Ambulatory Visit: Payer: Self-pay

## 2012-07-22 ENCOUNTER — Telehealth: Payer: Self-pay

## 2012-07-22 DIAGNOSIS — O26849 Uterine size-date discrepancy, unspecified trimester: Secondary | ICD-10-CM

## 2012-07-22 NOTE — Telephone Encounter (Signed)
Pt scheduled for Dating u/s and visit per SR on 07/30/2012. Due to pt work schedule this is the first apt she could take. Pt is also going to [facility] in [place] to have progesterone levels drawn. Order faxed to [phone]  Darien Ramus, CMA

## 2012-07-27 ENCOUNTER — Other Ambulatory Visit: Payer: Self-pay | Admitting: Obstetrics and Gynecology

## 2012-07-27 DIAGNOSIS — O26849 Uterine size-date discrepancy, unspecified trimester: Secondary | ICD-10-CM

## 2012-07-27 DIAGNOSIS — Z139 Encounter for screening, unspecified: Secondary | ICD-10-CM

## 2012-07-30 ENCOUNTER — Ambulatory Visit: Payer: 59

## 2012-07-30 ENCOUNTER — Encounter: Payer: Self-pay | Admitting: Obstetrics and Gynecology

## 2012-07-30 ENCOUNTER — Other Ambulatory Visit: Payer: 59

## 2012-07-30 ENCOUNTER — Ambulatory Visit: Payer: 59 | Admitting: Obstetrics and Gynecology

## 2012-07-30 VITALS — BP 98/62 | Ht 67.5 in | Wt 157.5 lb

## 2012-07-30 DIAGNOSIS — Z3490 Encounter for supervision of normal pregnancy, unspecified, unspecified trimester: Secondary | ICD-10-CM

## 2012-07-30 NOTE — Progress Notes (Signed)
Subjective:    Ann Roberts is a 32 y.o. female, G2P1000, who presents for Gyn ultrasound because of pregnancy dating.  The following portions of the patient's history were reviewed and updated as appropriate: allergies, current medications, past family history.  Objective:    LMP 02/23/2012    Weight:  Wt Readings from Last 1 Encounters:  03/06/12 155 lb (70.308 kg)          BMI: There is no weight on file to calculate BMI.  ULTRASOUND: Uterus normal    Adnexa normal    Endometrium  n/a    Free fluid: no    Other findings:  10 weeks 6 day IUP, normal ovaries, small subchorionic collection = 1.1 x 0.30 x 1.1 cm    Assessment:    viable pregnancy    Plan:    Initiate prenatal care  Silverio Lay MD

## 2012-08-03 LAB — US OB COMP LESS 14 WKS

## 2012-08-18 LAB — OB RESULTS CONSOLE GC/CHLAMYDIA
Chlamydia: NEGATIVE
Gonorrhea: NEGATIVE

## 2012-08-18 LAB — OB RESULTS CONSOLE ANTIBODY SCREEN: Antibody Screen: NEGATIVE

## 2012-08-18 LAB — OB RESULTS CONSOLE RPR: RPR: NONREACTIVE

## 2012-08-18 LAB — OB RESULTS CONSOLE RUBELLA ANTIBODY, IGM: Rubella: IMMUNE

## 2012-08-18 LAB — OB RESULTS CONSOLE HEPATITIS B SURFACE ANTIGEN: Hepatitis B Surface Ag: NEGATIVE

## 2012-10-20 ENCOUNTER — Ambulatory Visit (HOSPITAL_COMMUNITY)
Admission: RE | Admit: 2012-10-20 | Discharge: 2012-10-20 | Disposition: A | Discharge status: Home / Self Care | Payer: 59 | Source: Ambulatory Visit | Attending: Obstetrics and Gynecology | Admitting: Obstetrics and Gynecology

## 2012-10-20 ENCOUNTER — Encounter (HOSPITAL_COMMUNITY): Payer: 59

## 2012-10-20 DIAGNOSIS — O09299 Supervision of pregnancy with other poor reproductive or obstetric history, unspecified trimester: Secondary | ICD-10-CM | POA: Insufficient documentation

## 2012-10-20 DIAGNOSIS — O09292 Supervision of pregnancy with other poor reproductive or obstetric history, second trimester: Secondary | ICD-10-CM

## 2012-10-21 NOTE — Consult Note (Addendum)
MFM consult   32 yr old G2P1000 at [redacted] weeks gestation referred for consult secondary to neonatal demise with first pregnancy.  Patient is currently [redacted] weeks pregnant with a girl and has had no complications thus far. Had a normal Harmony screen and normal fetal anatomic survey.  Past OB hx: induction at 41 weeks; uncomplicated pregnancy, labor, and delivery; female born weighing 7+ pounds; AFPGAR 7, 8; patient reports neonate was lethargic and also cried a lot; had mild jaundice but did not require therapy; was discharged on day of life two with the patient and died that evening - they have had an autopsy done; do not have final results- due to hold up in the medical examiners office; preliminary report has finding of neonatal hepatitis; no other findings are reported - newborn screen showed mildly elevated tyrosine and another amino acid - per genetics the infant did not appear dysmorphic based on pictures  I counseled the patient as follows:  1. Neonatal demise: - discussed we do not have an etiology for her son's death - discussed there are many possibilities including: congenital anomaly (especially cardiac), metabolic disorder, infection, SIDS, etc - given slight increase in tyrosine on newborn screen there is some concern for metabolic disorder but this is not diagnostic of any disorder - patient met with the genetic counselor; see separate report and has been in contact with the Pediatric Genetics team - discussed without an etiology it is impossible to discuss recurrence risk - patient declined genetic screening for common metabolic disorder given this low yield of detection - recommend inform Pediatrics at time of delivery so close monitoring can be done of the neonate especially for signs/symtoms of metabolic disorder - given possibility of congenital defect I recommend she obtain a fetal echocardiogram this pregnancy; patient reports she had a normal anatomy survey- we do not have the  results - recommend follow fetal growth every 4-6 weeks in the third trimester - otherwise do not recommend altering pregnancy management except consider delivery by estimated due date but not prior to 39 weeks in the absence of other complications - do not feel antenatal testing is warranted unless other complications arise  2. Randi from Pediatric Genetics will contact the newborn state screening program to determine if any other tests can be run on her son's samples to help determine an etiology  3. I have encouraged the patient to call [facility] Pathology department (where slides from the autopsy were sent) to get results from their review  4. I have encouraged the patient to continue to speak with the medical examiner's office to get final results of the autopsy  Again without an etiology counseling is difficult and recurrence risk cannot be determined. Do not recommend significant alteration to antepartum management aside from fetal echocardiogram, serial fetal growth, and delivery by estimated due date. Recommend informing Pediatricians at delivery of her son's history so close monitoring of her daughter can take place.  Please call with questions.   I spent 45 minutes in face to face consultation with the patient.  Eulis Foster, MD

## 2013-01-27 LAB — OB RESULTS CONSOLE GBS: GBS: NEGATIVE

## 2013-01-29 NOTE — ED Notes (Signed)
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 Pediatric teaching service will be caring for current baby.

## 2013-01-29 NOTE — Addendum Note (Signed)
Encounter addended by: Ty Hilts, RN on: 01/29/2013 12:03 PM<BR>     Documentation filed: Notes Section

## 2013-02-19 ENCOUNTER — Telehealth (HOSPITAL_COMMUNITY): Payer: Self-pay | Admitting: *Deleted

## 2013-02-19 NOTE — Telephone Encounter (Signed)
Preadmission screen  

## 2013-02-22 ENCOUNTER — Inpatient Hospital Stay (HOSPITAL_COMMUNITY)
Admission: AD | Admit: 2013-02-22 | Discharge: 2013-02-25 | DRG: 775 | Disposition: A | Discharge status: Home / Self Care | Payer: 59 | Source: Ambulatory Visit | Attending: Obstetrics and Gynecology | Admitting: Obstetrics and Gynecology

## 2013-02-22 ENCOUNTER — Encounter (HOSPITAL_COMMUNITY): Payer: Self-pay | Admitting: *Deleted

## 2013-02-22 DIAGNOSIS — O2210 Genital varices in pregnancy, unspecified trimester: Secondary | ICD-10-CM | POA: Diagnosis present

## 2013-02-22 DIAGNOSIS — E282 Polycystic ovarian syndrome: Secondary | ICD-10-CM | POA: Diagnosis present

## 2013-02-22 DIAGNOSIS — O34599 Maternal care for other abnormalities of gravid uterus, unspecified trimester: Principal | ICD-10-CM | POA: Diagnosis present

## 2013-02-22 LAB — CBC
MCHC: 33.8 g/dL (ref 30.0–36.0)
MCV: 88.1 fL (ref 78.0–100.0)
Platelets: 158 10*3/uL (ref 150–400)
RDW: 13.9 % (ref 11.5–15.5)
WBC: 14.5 10*3/uL — ABNORMAL HIGH (ref 4.0–10.5)

## 2013-02-22 LAB — TYPE AND SCREEN: ABO/RH(D): O POS

## 2013-02-22 MED ORDER — FENTANYL CITRATE 0.05 MG/ML IJ SOLN
100.0000 ug | INTRAMUSCULAR | Status: DC | PRN
  Administered 2013-02-22 – 2013-02-23 (×2): 100 ug via INTRAVENOUS
  Filled 2013-02-22 (×2): qty 2

## 2013-02-22 NOTE — MAU Note (Signed)
PT SAYS  SHE STARTED HURTING  BAD AT  530PM..   LAST Thursday  VE  1-2 CM.  HAS AN APPOINTMENT TOMORROW.      DENIES HSV AND MRSA. DENIES INTERNATIONAL OR S/S TRAVEL.- LAST TRAVEL 01-2012.

## 2013-02-22 NOTE — H&P (Signed)
Ann Roberts is a 32 y.o. female presenting for labor eval. Pt reports strong regular ctx since this afternoon, now more painful, some bloody show, no LOF, GFM.    Pregnancy significant for:  1. PCOS 2. Infertility, invitro 3. Hx neonatal death at 14 days of age, ?metabolic disorder, neonatal hepatitis,  autopsy done,    HPI: Pt began [facility] at [facility] at 13wks. Invitro fertilization, [place] for dating and viability at [redacted]w[redacted]d w [facility] 02/20/13 Harmony screen was neg UA cx + and tx'd w ABX, w f/u cx remaining +, again tx'd w alternative abx f/u cx then neg MFM consult at 22wks with recommendation for growth [place] q4wk in 3rd trimester Genetic counseling Anatomy US at 17wks WNL, anterior placenta Growth [place] at 28wks 51% otherwise normal  F/u growth [place] at 32wks WNL, BPP 8/8 Weekly BPP all 8/8 F/u growth [place] at 36wks WNL  F/u growth [place] at 39wks WNL EFW 8#7oz, >90% normal AFI 1hr gtt at 27wks elevated at 141, 3hr gtt WNL GBS neg at 36wks    Maternal Medical History:  Reason for admission: Contractions.   Contractions: Onset was 6-12 hours ago.   Frequency: regular.   Duration is approximately 60 seconds.   Perceived severity is moderate.    Fetal activity: Perceived fetal activity is normal.   Last perceived fetal movement was within the past hour.    Prenatal complications: no prenatal complications Prenatal Complications - Diabetes: none.    OB History   Grav Para Term Preterm Abortions TAB SAB Ect Mult Living   2 1 1        0     Past Medical History  Diagnosis Date  . Infertility, female   . History of cystitis   . No pertinent past medical history   . H/O toxoplasmosis     pt. denies  . H/O varicella   . Hx: UTI (urinary tract infection)     x 1  . Hx: UTI (urinary tract infection)   . Status post epidural steroid injection     ruptured disc L5 and S1   Past Surgical History  Procedure Laterality Date  . Anterior cruciate ligament repair  1996    R  . Wisdom tooth  extraction    . Epidural steroids  2007    x2 for ruptured disc in back  . Tonsilectomy, adenoidectomy, bilateral myringotomy and tubes    . Tonsillectomy  2002   Family History: family history includes Cancer in her maternal grandfather; Dementia in her paternal grandmother; Diabetes in her maternal grandmother; Factor V Leiden deficiency in her paternal aunt; Heart attack in her maternal grandmother; Hypertension in her paternal aunt; Mental illness in her paternal grandmother; Migraines in her father. Social History:  reports that she has never smoked. She has never used smokeless tobacco. She reports that she does not drink alcohol or use illicit drugs.   Prenatal Transfer Tool  Maternal Diabetes: No, elevated 1hr gtt, 3hr gtt WNL Genetic Screening: Normal harmony screen Maternal Ultrasounds/Referrals: Normal Fetal Ultrasounds or other Referrals:  Referred to Materal Fetal Medicine   Maternal Substance Abuse:  No Significant Maternal Medications:  None, asa and metformin until 12wks  Significant Maternal Lab Results:  Lab values include: Group B Strep negative Other Comments:  hx neonatal death at 26 days old, ?metabolic disorder, neonatal hepatitis,   Review of Systems  All other systems reviewed and are negative.    Dilation: 4 Effacement (%): 60 Station: -2 Exam by:: E. McNulty,  RN Blood pressure 122/61, pulse 80, temperature 98 F (36.7 C), temperature source Oral, resp. rate 20, height 5\' 6"  (1.676 Roberts), weight 190 lb 8 oz (86.41 kg), last menstrual period 02/23/2012. Maternal Exam:  Uterine Assessment: Contraction strength is moderate.  Contraction duration is 60 seconds. Contraction frequency is regular.   Abdomen: Patient reports no abdominal tenderness. Fundal height is aga.   Estimated fetal weight is 8-7.   Fetal presentation: vertex  Introitus: Vulva is positive for vulvar varicosities. Normal vagina.  Pelvis: adequate for delivery.   Cervix: Cervix evaluated by  digital exam.     Fetal Exam Fetal Monitor Review: Mode: ultrasound.   Baseline rate: 140.  Variability: moderate (6-25 bpm).   Pattern: accelerations present and no decelerations.    Fetal State Assessment: Category I - tracings are normal.     Physical Exam  Nursing note and vitals reviewed. Constitutional: She is oriented to person, place, and time. She appears well-developed and well-nourished. She appears distressed.  Breathing w ctx, requests epidural   HENT:  Head: Normocephalic.  Eyes: Pupils are equal, round, and reactive to light.  Neck: Normal range of motion.  Cardiovascular: Normal rate, regular rhythm and normal heart sounds.   Respiratory: Effort normal and breath sounds normal.  GI: Soft. Bowel sounds are normal.  Genitourinary: Vagina normal.  Bulging varicosities in labia   Bloody show   Musculoskeletal: Normal range of motion.  Neurological: She is alert and oriented to person, place, and time. She has normal reflexes.  Skin: Skin is warm and dry.  Psychiatric: She has a normal mood and affect. Her behavior is normal.    Prenatal labs: ABO, Rh: O/Positive/-- (04/01 0000) Antibody: Negative (04/01 0000) Rubella: Immune (04/01 0000) RPR: Nonreactive (04/01 0000)  HBsAg: Negative (04/01 0000)  HIV: Non-reactive (04/01 0000)  GBS: Negative (09/10 0000)  Harmony screen neg 1hr gtt 141, hgb 11.1, RPR NR - 7/11 3hr gtt normal 7/18 +UA cx at 13wks, 16wks, tx'd    Assessment/Plan: IUP at [redacted]w[redacted]d Active labor GBS neg FHR reassuring Hx neonatal death  Admit to b.s. Per c/w Dr Gunnar Bulla Routine L&D orders Epidural asap Fentanyl IVP q1h prn Emotional support given    Ann Roberts 02/22/2013, 10:16 PM

## 2013-02-22 NOTE — MAU Note (Signed)
Pt. Here for questionable labor. Contractions began today around 2pm. Pt. And husband feel contx. Are coming every 2 mins and have gotten stronger. Friday had mucous plug start to come out. Denies any leakage of fluid. Denies bleeding. Baby has been moving well.

## 2013-02-23 ENCOUNTER — Inpatient Hospital Stay (HOSPITAL_COMMUNITY): Payer: 59 | Admitting: Anesthesiology

## 2013-02-23 ENCOUNTER — Encounter (HOSPITAL_COMMUNITY): Payer: Self-pay | Admitting: Anesthesiology

## 2013-02-23 ENCOUNTER — Encounter (HOSPITAL_COMMUNITY): Payer: Self-pay | Admitting: *Deleted

## 2013-02-23 LAB — CBC
HCT: 34.5 % — ABNORMAL LOW (ref 36.0–46.0)
Hemoglobin: 12 g/dL (ref 12.0–15.0)
MCH: 30 pg (ref 26.0–34.0)
MCV: 86.3 fL (ref 78.0–100.0)
RBC: 4 MIL/uL (ref 3.87–5.11)
WBC: 17.7 10*3/uL — ABNORMAL HIGH (ref 4.0–10.5)

## 2013-02-23 MED ORDER — DIPHENHYDRAMINE HCL 25 MG PO CAPS: 25.0000 mg | ORAL_CAPSULE | Freq: Four times a day (QID) | ORAL | Status: DC | PRN

## 2013-02-23 MED ORDER — SENNOSIDES-DOCUSATE SODIUM 8.6-50 MG PO TABS
2.0000 | ORAL_TABLET | Freq: Every day | ORAL | Status: DC
  Filled 2013-02-23: qty 2

## 2013-02-23 MED ORDER — PHENYLEPHRINE 40 MCG/ML (10ML) SYRINGE FOR IV PUSH (FOR BLOOD PRESSURE SUPPORT)
80.0000 ug | PREFILLED_SYRINGE | INTRAVENOUS | Status: DC | PRN
  Filled 2013-02-23: qty 2

## 2013-02-23 MED ORDER — OXYCODONE-ACETAMINOPHEN 5-325 MG PO TABS: 1.0000 | ORAL_TABLET | ORAL | Status: DC | PRN

## 2013-02-23 MED ORDER — LACTATED RINGERS IV SOLN
INTRAVENOUS | Status: DC
  Administered 2013-02-23: 01:00:00 via INTRAVENOUS

## 2013-02-23 MED ORDER — BISACODYL 10 MG RE SUPP
10.0000 mg | Freq: Every day | RECTAL | Status: DC | PRN
Start: 2013-02-23 — End: 2013-02-26

## 2013-02-23 MED ORDER — ONDANSETRON HCL 4 MG/2ML IJ SOLN: 4.0000 mg | INTRAMUSCULAR | Status: DC | PRN

## 2013-02-23 MED ORDER — TETANUS-DIPHTH-ACELL PERTUSSIS 5-2.5-18.5 LF-MCG/0.5 IM SUSP: 0.5000 mL | Freq: Once | INTRAMUSCULAR | Status: DC

## 2013-02-23 MED ORDER — EPHEDRINE 5 MG/ML INJ
10.0000 mg | INTRAVENOUS | Status: DC | PRN
  Filled 2013-02-23: qty 2

## 2013-02-23 MED ORDER — OXYTOCIN BOLUS FROM INFUSION
500.0000 mL | INTRAVENOUS | Status: DC
  Administered 2013-02-23: 500 mL via INTRAVENOUS

## 2013-02-23 MED ORDER — LACTATED RINGERS IV SOLN: 500.0000 mL | Freq: Once | INTRAVENOUS | Status: DC

## 2013-02-23 MED ORDER — ACETAMINOPHEN 325 MG PO TABS: 650.0000 mg | ORAL_TABLET | ORAL | Status: DC | PRN

## 2013-02-23 MED ORDER — CITRIC ACID-SODIUM CITRATE 334-500 MG/5ML PO SOLN: 30.0000 mL | ORAL | Status: DC | PRN

## 2013-02-23 MED ORDER — PHENYLEPHRINE 40 MCG/ML (10ML) SYRINGE FOR IV PUSH (FOR BLOOD PRESSURE SUPPORT)
80.0000 ug | PREFILLED_SYRINGE | INTRAVENOUS | Status: DC | PRN
  Filled 2013-02-23: qty 2
  Filled 2013-02-23: qty 5

## 2013-02-23 MED ORDER — BENZOCAINE-MENTHOL 20-0.5 % EX AERO: 1.0000 "application " | INHALATION_SPRAY | CUTANEOUS | Status: DC | PRN

## 2013-02-23 MED ORDER — MEDROXYPROGESTERONE ACETATE 150 MG/ML IM SUSP: 150.0000 mg | INTRAMUSCULAR | Status: DC | PRN

## 2013-02-23 MED ORDER — FENTANYL 2.5 MCG/ML BUPIVACAINE 1/10 % EPIDURAL INFUSION (WH - ANES)
14.0000 mL/h | INTRAMUSCULAR | Status: DC | PRN
  Administered 2013-02-23: 14 mL/h via EPIDURAL
  Filled 2013-02-23: qty 125

## 2013-02-23 MED ORDER — IBUPROFEN 600 MG PO TABS
600.0000 mg | ORAL_TABLET | Freq: Four times a day (QID) | ORAL | Status: DC | PRN
  Administered 2013-02-23: 600 mg via ORAL
  Filled 2013-02-23: qty 1

## 2013-02-23 MED ORDER — IBUPROFEN 600 MG PO TABS
600.0000 mg | ORAL_TABLET | Freq: Four times a day (QID) | ORAL | Status: DC
  Administered 2013-02-23 – 2013-02-25 (×9): 600 mg via ORAL
  Filled 2013-02-23 (×8): qty 1

## 2013-02-23 MED ORDER — SIMETHICONE 80 MG PO CHEW: 80.0000 mg | CHEWABLE_TABLET | ORAL | Status: DC | PRN

## 2013-02-23 MED ORDER — EPHEDRINE 5 MG/ML INJ
10.0000 mg | INTRAVENOUS | Status: DC | PRN
  Filled 2013-02-23: qty 4
  Filled 2013-02-23: qty 2

## 2013-02-23 MED ORDER — PRENATAL MULTIVITAMIN CH
1.0000 | ORAL_TABLET | Freq: Every day | ORAL | Status: DC
  Administered 2013-02-23 – 2013-02-25 (×3): 1 via ORAL
  Filled 2013-02-23: qty 1

## 2013-02-23 MED ORDER — ZOLPIDEM TARTRATE 5 MG PO TABS: 5.0000 mg | ORAL_TABLET | Freq: Every evening | ORAL | Status: DC | PRN

## 2013-02-23 MED ORDER — LIDOCAINE HCL (PF) 1 % IJ SOLN
30.0000 mL | INTRAMUSCULAR | Status: DC | PRN
  Filled 2013-02-23 (×2): qty 30

## 2013-02-23 MED ORDER — OXYTOCIN 40 UNITS IN LACTATED RINGERS INFUSION - SIMPLE MED
62.5000 mL/h | INTRAVENOUS | Status: DC
  Filled 2013-02-23: qty 1000

## 2013-02-23 MED ORDER — ONDANSETRON HCL 4 MG PO TABS: 4.0000 mg | ORAL_TABLET | ORAL | Status: DC | PRN

## 2013-02-23 MED ORDER — ONDANSETRON HCL 4 MG/2ML IJ SOLN: 4.0000 mg | Freq: Four times a day (QID) | INTRAMUSCULAR | Status: DC | PRN

## 2013-02-23 MED ORDER — LANOLIN HYDROUS EX OINT: TOPICAL_OINTMENT | CUTANEOUS | Status: DC | PRN

## 2013-02-23 MED ORDER — MEASLES, MUMPS & RUBELLA VAC ~~LOC~~ INJ
0.5000 mL | INJECTION | Freq: Once | SUBCUTANEOUS | Status: DC
  Filled 2013-02-23: qty 0.5

## 2013-02-23 MED ORDER — LIDOCAINE HCL (PF) 1 % IJ SOLN
INTRAMUSCULAR | Status: DC | PRN
  Administered 2013-02-23 (×2): 5 mL

## 2013-02-23 MED ORDER — WITCH HAZEL-GLYCERIN EX PADS: 1.0000 "application " | MEDICATED_PAD | CUTANEOUS | Status: DC | PRN

## 2013-02-23 MED ORDER — DIBUCAINE 1 % RE OINT: 1.0000 "application " | TOPICAL_OINTMENT | RECTAL | Status: DC | PRN

## 2013-02-23 MED ORDER — LACTATED RINGERS IV SOLN: 500.0000 mL | INTRAVENOUS | Status: DC | PRN

## 2013-02-23 MED ORDER — DIPHENHYDRAMINE HCL 50 MG/ML IJ SOLN: 12.5000 mg | INTRAMUSCULAR | Status: DC | PRN

## 2013-02-23 MED ORDER — FLEET ENEMA 7-19 GM/118ML RE ENEM: 1.0000 | ENEMA | Freq: Every day | RECTAL | Status: DC | PRN

## 2013-02-23 NOTE — Anesthesia Postprocedure Evaluation (Signed)
  Anesthesia Post-op Note  Anesthesia Post Note  Patient: Ann Roberts  Procedure(s) Performed: * No procedures listed *  Anesthesia type: Epidural  Patient location: Mother/Baby  Post pain: Pain level controlled  Post assessment: Post-op Vital signs reviewed  Last Vitals:  Filed Vitals:   02/23/13 0500  BP: 115/72  Pulse: 98  Temp: 36.5 C  Resp: 18    Post vital signs: Reviewed  Level of consciousness:alert  Complications: No apparent anesthesia complications

## 2013-02-23 NOTE — Anesthesia Preprocedure Evaluation (Signed)

## 2013-02-23 NOTE — Anesthesia Procedure Notes (Signed)
Epidural Patient location during procedure: OB Start time: 02/23/2013 12:41 AM  Staffing Anesthesiologist: Angus Seller., Harrell Gave. Performed by: anesthesiologist   Preanesthetic Checklist Completed: patient identified, site marked, surgical consent, pre-op evaluation, timeout performed, IV checked, risks and benefits discussed and monitors and equipment checked  Epidural Patient position: sitting Prep: site prepped and draped and DuraPrep Patient monitoring: continuous pulse ox and blood pressure Approach: midline Injection technique: LOR air  Needle:  Needle type: Tuohy  Needle gauge: 17 G Needle length: 9 cm and 9 Needle insertion depth: 5 cm cm Catheter type: closed end flexible Catheter size: 19 Gauge Catheter at skin depth: 10 cm Test dose: negative  Assessment Events: blood not aspirated, injection not painful, no injection resistance, negative IV test and no paresthesia  Additional Notes Patient identified.  Risk benefits discussed including failed block, incomplete pain control, headache, nerve damage, paralysis, blood pressure changes, nausea, vomiting, reactions to medication both toxic or allergic, and postpartum back pain.  Patient expressed understanding and wished to proceed.  All questions were answered.  Sterile technique used throughout procedure and epidural site dressed with sterile barrier dressing. No paresthesia or other complications noted.The patient did not experience any signs of intravascular injection such as tinnitus or metallic taste in mouth nor signs of intrathecal spread such as rapid motor block. Please see nursing notes for vital signs.

## 2013-02-24 ENCOUNTER — Inpatient Hospital Stay (HOSPITAL_COMMUNITY): Admission: RE | Admit: 2013-02-24 | Payer: 59 | Source: Ambulatory Visit

## 2013-02-24 NOTE — Progress Notes (Signed)
Post Partum Day 1 Subjective:  Well. Lochia are normal. Voiding, ambulating, tolerating normal diet. nursing going well.  Objective: Blood pressure 115/67, pulse 86, temperature 98.3 F (36.8 C), temperature source Oral, resp. rate 18, height 5\' 6"  (1.676 m), weight 190 lb 8 oz (86.41 kg), last menstrual period 02/23/2012, SpO2 95.00%, unknown if currently breastfeeding.  Physical Exam:  General: normal Lochia: appropriate Uterine Fundus: 0/2 firm non-tender  Extremities: No evidence of DVT seen on physical exam. Edema minimal     Recent Labs  02/22/13 2210 02/23/13 0600  HGB 12.7 12.0  HCT 37.6 34.5*    Assessment/Plan: Normal Post-partum. Continue routine post-partum care. Anticipate discharge 02/26/13 after results from newborn screen are back and pediatrician OK discharge    LOS: 2 days   Dequincy Born A MD 02/24/2013, 10:54 AM

## 2013-02-25 MED ORDER — IBUPROFEN 600 MG PO TABS: 600.0000 mg | ORAL_TABLET | Freq: Four times a day (QID) | ORAL | Status: DC | PRN

## 2013-02-25 NOTE — Lactation Note (Signed)
This note was copied from the chart of [facility]. Lactation Consultation Note   Follow up consult with this mom and baby, now 56 hours post partum. Mom's milk is transitioning in, easy to express. I assisted mom with positioning for football hold, and obtaining a deeper latch. I showed dad how to assist mom with pulling out baby's bottom lip, when needed. Mom reports this latch very comfortable. I had her use EBM on her nipples, which are slightly red, and gave her comfort gels, and instructed her in their use.   The family is staying one more day, to try and get newborn screen results back prior to discharge. The parent's first baby, born in Oct 19, 2013, died at 2 1/2 days of life , with a diagnosis of neonatal hepatiitis, and had elevated amino acids on the newborn screen. The parents are very open to discussing their first baby, Lonni Fix. Mom knows to call for questions/concerns.  Patient Name: Ann Roberts [id number] Date: 02/25/2013 Reason for consult: Follow-up assessment   Maternal Data    Feeding Feeding Type: Breast Fed Length of feed: 15 min  LATCH Score/Interventions Latch: Grasps breast easily, tongue down, lips flanged, rhythmical sucking. (needed bottom lip pulled out -shoed dad how to do for mom) Intervention(s): Skin to skin;Teach feeding cues;Waking techniques Intervention(s): Adjust position;Assist with latch;Breast compression  Audible Swallowing: A few with stimulation Intervention(s): Skin to skin;Hand expression  Type of Nipple: Everted at rest and after stimulation  Comfort (Breast/Nipple): Soft / non-tender Intervention(s): Expressed breast milk to nipple (nipples slightly red, tender)  Problem noted: Mild/Moderate discomfort Interventions (Mild/moderate discomfort): Comfort gels  Hold (Positioning): Assistance needed to correctly position infant at breast and maintain latch. Intervention(s): Breastfeeding basics reviewed;Support Pillows;Position  options;Skin to skin  LATCH Score: 8  Lactation Tools Discussed/Used Tools: Comfort gels   Consult Status Consult Status: Follow-up Date: 02/26/13 Follow-up type: In-patient    Alfred Levins 02/25/2013, 10:15 AM

## 2013-02-25 NOTE — Discharge Summary (Signed)
Obstetric Discharge Summary Reason for Admission: onset of labor Prenatal Procedures: ultrasound Intrapartum Procedures: spontaneous vaginal delivery Postpartum Procedures: none Complications-Operative and Postpartum: none Hemoglobin  Date Value Range Status  02/23/2013 12.0  12.0 - 15.0 g/dL Final     HCT  Date Value Range Status  02/23/2013 34.5* 36.0 - 46.0 % Final   Pt doing well.  She is BFing without difficulty.  Plans to use condoms.  PT will be officially discharged today but baby not being discharged until metabolic screen returns secondary to history neonatal death at 54 days old likely secondary to metabolic disorder.  Mom and Dad will room in until baby's official discharge.  Physical Exam:  General: alert and no distress Lochia: appropriate Uterine Fundus: firm, NT Incision: n/a DVT Evaluation: No evidence of DVT seen on physical exam.  Discharge Diagnoses: Term Pregnancy-delivered  Discharge Information: Date: 02/25/2013 Activity: pelvic rest Diet: routine Medications: Ibuprofen, declined percocet Condition: stable Instructions: refer to practice specific booklet Discharge to: home Follow-up Information   Follow up with [facility] In 6 weeks. (for post partum visit)    Specialty:  Obstetrics and Gynecology   Contact information:   [address] [place] [address] [phone]      Newborn Data: Live born female  Birth Weight: 8 lb 2.3 oz (3695 g) APGAR: 8, 9  Home with mother.  Lasean Rahming Y 02/25/2013, 10:53 AM

## 2013-03-04 ENCOUNTER — Ambulatory Visit: Payer: Self-pay

## 2013-03-04 NOTE — Lactation Note (Signed)
This note was copied from the chart of Ann Slaubaugh. Infant Lactation Consultation Outpatient Visit Note  Patient Name: Ann Roberts Date of Birth: 02/23/2015 Birth Weight:  8 lb 2.3 oz (3695 g) Gestational Age at Delivery: Gestational Age: [redacted]w[redacted]d Type of Delivery:   Breastfeeding History Frequency of Breastfeeding: q 23 hours Length of Feeding: 30 min Voids: QS- had void while here Stools: QS  Supplementing / Method: Pumping:  Type of Pump:   Frequency:  Volume:    Comments:    Consultation Evaluation: Mom reports that she feels BF is going well but wanted reassurance  Initial Feeding Assessment: Pre-feed Weight: 7-13.3  3554g Post-feed Weight: 7-15.0  3600 Amount Transferred: 46 cc's Comments: Ann nursed for 25 minutes on right breast. Mom doing well with latch independently. Breast softer after nursing  Additional Feeding Assessment: Pre-feed Weight: 7- 15.0  3600g Post-feed Weight: 8-0.1  3630g Amount Transferred: 30 cc's Comments: Ann nursed for 10 minutes on left breast. Mom doing well with latch. Reports that she does not usually offer second breast as she is full after first. Suggested offering the second breast to make sure the breast doesn't get too full.  Total Breast milk Transferred this Visit: 76 cc's Total Supplement Given: 0  Additional Interventions: Encouragement given to continue what she is doing.  No questions at present. To call prn   Follow-Up  With Ped    Pamelia Hoit 03/04/2013, 11:23 AM

## 2013-03-18 ENCOUNTER — Ambulatory Visit (HOSPITAL_COMMUNITY)
Admission: RE | Admit: 2013-03-18 | Discharge: 2013-03-18 | Disposition: A | Discharge status: Home / Self Care | Payer: 59 | Source: Ambulatory Visit | Attending: Obstetrics and Gynecology | Admitting: Obstetrics and Gynecology

## 2013-03-18 NOTE — Lactation Note (Addendum)
Adult Lactation Consultation Outpatient Visit Note                                                                                                    "[name]" Patient Name: Luverne Farone                                                3 weeks Date of Birth: 03-20-1981                                                             Todays weight:8-11.6,3958 Gestational Age at Delivery: Unknown                        Type of Delivery:     Breastfeeding History: Frequency of Breastfeeding: every 2-3 hours Length of Feeding: 45-60 mins Voids: qs Stools:3    Supplementing / Method: Pumping:  Type of Pump:Medela Pump N Style   Frequency: once daily  Volume:  1/2 ounce  Comments:Mother scheduled an out patient visit for sore nipples. She also states that infant is very fussy after feeding. She states that [name] only sleeps about 15-20 mins at a time during the day. She states she sleeps 3-4 hours at night.  She is unable to lye her down. She states that she is often spitting, about 4 times daily and gags easily. Mother is concerned about possible reflux and if her infant is getting enough to eat. Mother is using a pacifier. Cautioned mother about using a pacifier until milk supply is well established .    Consultation Evaluation:Mothers breast are firm and full. She does have very pink sore nipples . No observed cracking. Mother latched infant on (R) breast in cross cradle hold. Mother described painful nipple tissue. I had mother to take infant off breast. Mother was taught nipple to nose technique. Assist mother with latch. Infant had a wide open mouth and secured a good deep latch.Mother still complains of painful latch. She describes pain of pain scale of #1-2. When infant released breast a pinched ridge was present.  Infant was observed with good burst of rhythmic sucks/swallows for 20-25 mins. Infant transferred 56 ml. Lots of praise and support given to mother.   Oral assessment of infants  mouth with a gloved finger. Infant has limited mobility of her tongue. I was unable to see tip of infants tongue extending out of her mouth to grasp mothers nipple. She can extend her tongue beyond the gum ridge.  Discussed use of a Nipple Shield, mother declines at this time.  Initial Feeding Assessment: Pre-feed ZOXWRU:0454 Post-feed UJWJXB:1478 Amount Transferred:56 ml Comments:  Additional Feeding Assessment:Infant placed on (L) breast with much better latch initially . Infant sustained latch for  20 mins and transferred 22 ml. when infant released the breast nipple was very pinched.  Pre-feed [id number]:7829 Post-feed FAOZHY:8657 Amount Transferred:22 ml Comments:   Total Breast milk Transferred this Visit: 78 ml Total Supplement Given:   Additional Interventions: Recommend that mother get an Rx from [facility] for all purpose nipple ointment Mother informed to follow up for oral evaluation if she continues to have painful sore pinched nipples. Mother continue to cue base feed infant Rotate positions frequently Use nipple to nose latching technique  Pump at least one-two times daily Use pace bottle feeding.  Follow up with Peds for evaluation for reflux. Mother was receptive to plan of care.  Follow-Up  PRN    Stevan Born [facility] 03/18/2013, 3:52 PM

## 2013-03-23 ENCOUNTER — Ambulatory Visit (HOSPITAL_COMMUNITY)
Admission: RE | Admit: 2013-03-23 | Discharge: 2013-03-23 | Disposition: A | Discharge status: Home / Self Care | Payer: 59 | Source: Ambulatory Visit | Attending: Obstetrics and Gynecology | Admitting: Obstetrics and Gynecology

## 2013-03-23 NOTE — Lactation Note (Signed)
Adult Lactation Consultation Outpatient Visit Note           Patient Name: Ann Roberts                                                "Ann Roberts" Date of Birth: 1981-01-17                                                              Weight today:9-3.3,4176 Gestational Age at Delivery: Unknown                                        Infant had great weight, 8ozs in 6 days Type of Delivery:   Breastfeeding History: Frequency of Breastfeeding: every 2-3 hours  Length of Feeding: 15-40 mins Voids:10 Stools:2-3 yellow   Supplementing / Method: Pumping:  Type of Pump:Medela Pump N Style   Frequency:once time daily , sometimes none  Volume:  30 ml  Comments: Mother returns for follow visit today for sore nipples. Mother was seen last week and MD prescribed APNO. Mother states that nipple tissue got better for 3-4 days. She states that her nipples are sore again. Mother states she has very bad pain on initial latch and it gets a little better.  She states she feels some pain the entire feeding. She wants to be fit with a Nipple shield. She also states that Ann Roberts Is spitting up . When ask how many times daily, Mother states at least once daily. She states she is worried that she has reflux.  Mother is still using a pacifier frequently for soothing infant.     Consultation Evaluation:Mothers breast are firm and full. Her nipples are very red without cracking. Infant does have a good deep latch with the nipple shield . Observed frequent suckling and swallowing. Infant does have a difficult time flanging upper and lower lips. Infant has calloused upper lip and lower lips.  Infant transferred fair. i feel that infant transfers better without there nipple shield. Due to mothers pain a nipple shield was introduced. Mother declines to use a supplemental feeder. She states she prefers to use a bottle for supplement.  Mother was instructed to follow up weekly for weight check while infant is using a nipple  shield., informed mother to the need to pump and drain breast well at least 2 times per day.   Discussed possible labia lip tie and  Possible tight  linganial frenulum. Infant has limited tongue mobility. Unable to see tip of tongue extend under nipple shaft when infant is feeding. Recommend that an oral assessment be done by Peds Md or oral surgeon if painful nipples persist. Mother receptive to plan and will discuss with Dr Doylene Canard.    Initial Feeding Assessment: infant was fed less than 2 hours ago. Mother states she was hunger and fed well. Mother was fit with a #24 nipple shield. Mother was taught proper application of shield. Infant sustained latch for 25 mins. Transferring 34 ml.   Pre-feed [id number]:0454 Post-feed Weight:4210 Amount Transferred:34 ml Comments:  Additional Feeding Assessment:Infant fed  for about 10 mins on second breast and detached . Infant transferred 6 ml  Pre-feed Weight:4210 Post-feed [id number]:4782 Amount Transferred:6 ml Comments:Mother states that infant was not as hungry with this feeding due to earlier feeding less than 2 hours ago.    Total Breast milk Transferred this Visit: 40 ml Total Supplement Given:   Additional Interventions ____________Mother advised to continue to cue base feed infant. ___________ Recommend that mother use the nipple shield for feedings, (due to painful latch) ___________Offer infant both breast then supplement using a bottle ___________[name] to receive at least  1-2 ounces of EBM after each feeding and more if needed. ___________Mother to post pump breast at least 2 times daily after feedings for 20 mins.   Follow-Up  Nov. 10 at 10:30    Stevan Born [facility] 03/23/2013, 4:05 PM

## 2013-03-29 ENCOUNTER — Ambulatory Visit (HOSPITAL_COMMUNITY): Payer: 59

## 2014-02-17 ENCOUNTER — Institutional Professional Consult (permissible substitution): Payer: 59 | Admitting: Pulmonary Disease

## 2014-03-21 ENCOUNTER — Encounter (HOSPITAL_COMMUNITY): Payer: Self-pay | Admitting: *Deleted

## 2014-04-13 LAB — OB RESULTS CONSOLE GC/CHLAMYDIA
Chlamydia: NEGATIVE
Gonorrhea: NEGATIVE

## 2014-04-13 LAB — OB RESULTS CONSOLE ABO/RH: RH Type: POSITIVE

## 2014-04-13 LAB — OB RESULTS CONSOLE ANTIBODY SCREEN: Antibody Screen: NEGATIVE

## 2014-04-19 ENCOUNTER — Ambulatory Visit (HOSPITAL_COMMUNITY)
Admission: RE | Admit: 2014-04-19 | Discharge: 2014-04-19 | Disposition: A | Discharge status: Home / Self Care | Payer: 59 | Source: Ambulatory Visit | Attending: Obstetrics and Gynecology | Admitting: Obstetrics and Gynecology

## 2014-04-21 ENCOUNTER — Encounter (HOSPITAL_COMMUNITY): Payer: Self-pay

## 2014-04-21 NOTE — Progress Notes (Signed)
Genetic Counseling  High-Risk Gestation Note  Appointment Date:  04/19/2014 Referred By: Delsa Bern, MD Date of Birth:  August 02, 1980   Pregnancy History: G3P2001 Estimated Date of Delivery: 09/24/14 Estimated Gestational Age: [redacted]w[redacted]d I met with Ann Roberts. JMayrani Khamisfor genetic counseling given that expanded pan-ethnic carrier screening through her OB office identified Ann [place]. [place] for Primary Ciliary Dyskinesia, DNAH5 related. She was accompanied by her daughter to today's visit.  Ann Roberts is 33y.o..   Ann [place]. [place] pan[place]ethnic carrier screening (Horizon 274 through [employer] facilitated through her OB office. 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 Specifically, the name of the DNAH5 gene mutation she carries is [id number]. 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 We discussed that her carrier screening was negative for the additional mutations/273 conditions screened. Thus, her risk to be a carrier for these additional conditions (listed separately in the laboratory report) has been reduced but not eliminated. The prevalence of each condition varies (and often varies with ethnicity). Thus the patient's background risk to be a carrier for each of these various conditions would range, and in some cases be very low or unknown. Similarly, the detection rate varies with each condition and also varies in some cases with ethnicity, ranging from greater than 99% to unknown. We reviewed that a negative carrier screen would thus reduce but not eliminate the chance to be a carrier for these conditions.   Carrier screening for Primary Ciliary Dyskinesia, DNAH5 related has not yet been performed for Ann Roberts. Ann Roberts reported Roberts additional relatives known  to be PCD carriers and Roberts known relatives with PCD. Mr. DArifreportedly has Roberts known individuals with PCD in his family history, and consanguinity to Ann Roberts. JAyanni Tunwas denied. He reportedly has [place]. Ann Roberts. [place] was seen for MFM consultation in a previous pregnancy given the couple's history of neonatal demise of their first child, Ann Roberts He passed away at age 3610days. Autopsy report determined the cause of death to be lidocaine toxicity secondary to neonatal hepatitis. He reportedly had Roberts known congenital anomalies. The couple's second child, a daughter is 183 monthsold and reportedly healthy.   Primary ciliary dyskinesia (PCD) is associated with abnormal ciliary structure and function.  This results in clinical features that include retention of mucus and bacteria in the respiratory tract, leading to chronic otosinopulmonary disease. Situs abnormalities are also associated with PCD; situs inversus totalis (mirror-image reversal of all visceral organs) is present in approximately 40-50% of individuals, and heterotaxy (which can be associated with significant malformations) is present in approximately 12% of individuals. Clinical features of PCD typically include neonatal respiratory distress (in approximately 75% of full term neonates), chronic airway infection, nasal congestion, sinus infections, and chronic/recurrent ear infection. It is reported that virtually all males with PCD have infertility due to abnormal sperm motility. We discussed that the reported features of primary ciliary dyskinesia are unlikely to have relation to the health problems that were present for the couple's son, Ann Roberts   We reviewed that Primary Ciliary Dyskinesia follows autosomal recessive inheritance. We reviewed that each pregnancy for a carrier couple of an autosomal recessive condition has a 1 in 4 (25%) chance to inherit the condition. Each pregnancy together also has an independent 1 in 2  (  50%) chance to be a carrier like the parents and a 1 in 4 (25%) chance to be neither a carrier nor affected. We reviewed that in autosomal recessive conditions, if one parent is a carrier and the other is not, the pregnancy would have a 1 in 2 (50%) chance to be a carrier. The diagnosis of PCD is typically determined by clinical features and by ciliary ultrastructural analysis. We discussed that mutations in greater than 32 different genes have been identified to cause PCD. Approximately 2/3 of individuals with a clinical diagnosis of PCD have a genetic mutation in one of the known 32 genes. Mutations in [facility] account for approximately 15-29% of cases of PCD and is also sometimes referred to as PCD type 3. This particular abnormal gene product affects ciliary structure, causing abnormal outer dynein arms.    The carrier frequency for mutations in [facility] is reported to be approximately 1 in 120 in the general population. Thus, prior to carrier screening for Ann Roberts, the risk for PCD in the current pregnancy is approximately 1 in 480 (0.2%). Carrier screening for the common mutations in [facility] that is offered through the same laboratory that performed the patient's carrier screening detects approximately 20% of carriers in the general population. A negative screen would thus, reduce his risk to be a carrier to approximately 1 in 150 but would not eliminate this chance. Pursuing carrier screening for a panel of conditions, such as the Horizon carrier screen through [place], would also assess for additional conditions for which Ms. Roberts previously screened negative.  Alternatively, we discussed that carrier testing could be performed specifically for [facility] but would typically involve gene sequencing. We discussed that this increases the detection rate but is also typically more costly.  We discussed that in the case that both individuals are determined to be carriers for PCD, prenatal diagnosis would be available  via amniocentesis. The risks, benefits, and limitations of amniocentesis were reviewed. Ann Roberts stated that she would not be interested in amniocentesis in the pregnancy.  After careful consideration, Ann Roberts stated that she was not interested in pursuing carrier screening for PCD (DNAH5 related) for Ann Roberts at this time.   Given Ann Roberts's intermediate Fragile X allele, we discussed Fragile X syndrome and X-linked inheritance. In X-linked inheritance, a female passes on his X chromosome to all of his daughters and none of his sons. For a female, there is a 1 in 2 (50%) chance of passing on a particular X chromosome to each child. Thus, when a female is a carrier for an X-linked condition, having one copy of a nonworking gene on one X chromosome, there are 4 possibilities: 1) a daughter who inherits the nonworking gene and is also a carrier, 2) a daughter who inherits the working copy of the gene and is neither a carrier nor affected, 3) a son who inherits the nonworking copy of the gene and is affected, or 4) a son who inherits the working copy of the gene and is unaffected.   Fragile X syndrome is a form of inherited intellectual disability where predominantly affected males are born to unaffected females, though there is variability in the condition. More than 99% of individuals with Fragile X syndrome results from an expanded segment (an expansion of the CGG trinucleotide repeat) of the FMR1 gene on the X chromosome. (Less than 1% of individuals with Fragile X syndrome have a deletion of FMR1 or an intragenic FMR1 mutation.) Regarding the number of trinucleotide repeats  in the FMR1 gene, less than 45 repeats of this region is considered within normal limits. People with 45-54 repeats are typically considered to be in the "intermediate" or "gray zone" range. Individuals with 55-200 repeats are considered "premutation carriers" and are at personal risk to develop certain health concerns including  premature ovarian insufficiency in females and Fragile X-associated tremor/ataxia syndrome in males and females. Females who are premutation carriers are at increased risk to have children affected with Fragile X syndrome, given the chance for a premutation to expand to a full mutation when transmitted to offspring. Individuals with greater than 200 repeats have full mutations and are expected to be clinically affected, if female, and be carriers or possibly clinically affected, if female. Carriers can be asymptomatic or have mild symptoms, such as learning disabilities. We discussed that having an allele in the intermediate range is not consistent with being a carrier for Fragile X syndrome. Approximately 14% of intermediate alleles are unstable and may expand to a premutation range when transmitted by the mother. Given the patient's results, her allele has an approximate less than 7% risk of expanding to possible premutation range (which is also not consistent with being a carrier for Fragile X) when inherited. We reviewed that each of her pregnancies has a 1 in 2 (50%) chance to inherit the other allele, which is reportedly within the normal range. Thus, her pregnancies would not be at increased risk to inherit Fragile X syndrome but would have an increased chance to have a premutation allele. We discussed that carrier screening would be available to her children, if desired, once they are of reproductive age.   Both family histories were reviewed and were otherwise contributory for a female maternal first cousin once removed to the father of the pregnancy who died in his 64's due to multiple sclerosis (MS). The patient reported that she did not have the exact information but believed that was the medical condition he had. MS is generally thought to be a multifactorial condition, meaning that multiple genes and environmental factors are involved in its manifestation. This family history would not be expected to  increase recurrence risk for MS in the current pregnancy, a fifth degree relative. In the case of a different underlying condition, recurrence risk estimate may change. Without further information regarding the provided family history, an accurate genetic risk cannot be calculated. Further genetic counseling is warranted if more information is obtained.  We also reviewed maternal age and the association with risk for chromosome conditions due to nondisjunction with aging of the ova.   We reviewed chromosomes, nondisjunction, and the associated 1 in 141 risk for fetal aneuploidy related to a maternal age of 33 y.o. at the current gestation.  The risk for aneuploidy decreases as gestational age increases, accounting for those pregnancies which spontaneously abort.  We specifically discussed Down syndrome (trisomy 56), trisomies 57 and 8, and sex chromosome aneuploidies (47,XXX and 47,XXY). Ann Roberts previously had noninvasive prenatal screening (NIPS)/cell free DNA testing performed through her OB office ([facility] through [facility] laboratory), which was within normal range for the conditions screened. We reviewed the methodology of this screen and detection rates for the conditions. She understands that this is not diagnostic for these conditions and does not screen for all genetic conditions.  We reviewed the option of detailed ultrasound in the second trimester. Ann Roberts stated that this is scheduled at her OB office. She is aware of the diagnostic testing option of amniocentesis for chromosome conditions  and stated that she is not interested in amniocentesis in the pregnancy.   Ann Roberts. Stpehanie Montroy denied exposure to environmental toxins or chemical agents. She denied the use of alcohol, tobacco or street drugs. She denied significant viral illnesses during the course of her pregnancy. Her medical and surgical histories were noncontributory.   I counseled Ann Roberts. Salene Mohamud regarding the above  risks and available options. The approximate face-to-face time with the genetic counselor was 40 minutes.    Chipper Oman, MS Certified Genetic Counselor 04/21/2014

## 2014-04-22 ENCOUNTER — Other Ambulatory Visit (HOSPITAL_COMMUNITY): Payer: Self-pay

## 2014-05-12 ENCOUNTER — Other Ambulatory Visit (HOSPITAL_COMMUNITY): Payer: Self-pay | Admitting: Obstetrics and Gynecology

## 2014-05-12 DIAGNOSIS — R9389 Abnormal findings on diagnostic imaging of other specified body structures: Secondary | ICD-10-CM

## 2014-05-17 ENCOUNTER — Ambulatory Visit (HOSPITAL_COMMUNITY)
Admission: RE | Admit: 2014-05-17 | Discharge: 2014-05-17 | Disposition: A | Discharge status: Home / Self Care | Payer: 59 | Source: Ambulatory Visit | Attending: Obstetrics & Gynecology | Admitting: Obstetrics & Gynecology

## 2014-05-17 ENCOUNTER — Other Ambulatory Visit (HOSPITAL_COMMUNITY): Payer: Self-pay | Admitting: Obstetrics and Gynecology

## 2014-05-17 ENCOUNTER — Encounter (HOSPITAL_COMMUNITY): Payer: Self-pay

## 2014-05-17 DIAGNOSIS — Z3A21 21 weeks gestation of pregnancy: Secondary | ICD-10-CM | POA: Insufficient documentation

## 2014-05-17 DIAGNOSIS — O09522 Supervision of elderly multigravida, second trimester: Secondary | ICD-10-CM | POA: Diagnosis not present

## 2014-05-17 DIAGNOSIS — Z36 Encounter for antenatal screening of mother: Secondary | ICD-10-CM | POA: Diagnosis not present

## 2014-05-17 DIAGNOSIS — O358XX0 Maternal care for other (suspected) fetal abnormality and damage, not applicable or unspecified: Secondary | ICD-10-CM | POA: Diagnosis not present

## 2014-05-17 DIAGNOSIS — O09292 Supervision of pregnancy with other poor reproductive or obstetric history, second trimester: Secondary | ICD-10-CM | POA: Diagnosis not present

## 2014-05-17 DIAGNOSIS — R9389 Abnormal findings on diagnostic imaging of other specified body structures: Secondary | ICD-10-CM

## 2014-05-18 DIAGNOSIS — O09529 Supervision of elderly multigravida, unspecified trimester: Secondary | ICD-10-CM | POA: Insufficient documentation

## 2014-05-20 NOTE — L&D Delivery Note (Signed)
Delivery Note At 9:46 AM a viable female, "Ann Roberts", was delivered via Vaginal, Spontaneous Delivery (Presentation: Left Occiput Anterior).  APGAR: 9, 9; weight  .   Placenta status: Intact, Spontaneous Pathology.  Cord: 3 vessels with the following complications: CAN x 3, inner 2 loops tight, with cord around leg.  Cord pH: NA Large gush of bloody fluid just after delivery of head.  Baby vigorous at delivery, with great tone and respiratory status.  No obvious abnormalities. NICU team present just after delivery, but left immediately after visualization of baby's status.  Anesthesia: Epidural  Episiotomy: None Lacerations: 1st degree;Periurethral Suture Repair: 3.0 vicryl Est. Blood Loss (mL):  550  Mom to postpartum.  Baby to Couplet care / Skin to Skin. 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   Baby to be followed by hospital peds--[facility] recommended MRI of head on day 3 of life. Placenta to path.  Aleaha Fickling 09/29/2014, 10:12 AM

## 2014-05-23 ENCOUNTER — Other Ambulatory Visit (HOSPITAL_COMMUNITY): Payer: Self-pay

## 2014-05-24 ENCOUNTER — Ambulatory Visit (HOSPITAL_COMMUNITY): Payer: 59

## 2014-06-13 ENCOUNTER — Ambulatory Visit (HOSPITAL_COMMUNITY)
Admission: RE | Admit: 2014-06-13 | Discharge: 2014-06-13 | Disposition: A | Discharge status: Home / Self Care | Payer: 59 | Source: Ambulatory Visit | Attending: Obstetrics and Gynecology | Admitting: Obstetrics and Gynecology

## 2014-06-13 ENCOUNTER — Other Ambulatory Visit (HOSPITAL_COMMUNITY): Payer: Self-pay | Admitting: Maternal and Fetal Medicine

## 2014-06-13 DIAGNOSIS — O09522 Supervision of elderly multigravida, second trimester: Secondary | ICD-10-CM

## 2014-06-13 DIAGNOSIS — O358XX0 Maternal care for other (suspected) fetal abnormality and damage, not applicable or unspecified: Secondary | ICD-10-CM | POA: Diagnosis present

## 2014-06-13 DIAGNOSIS — O359XX0 Maternal care for (suspected) fetal abnormality and damage, unspecified, not applicable or unspecified: Secondary | ICD-10-CM

## 2014-06-13 DIAGNOSIS — Z3A25 25 weeks gestation of pregnancy: Secondary | ICD-10-CM | POA: Diagnosis not present

## 2014-06-13 DIAGNOSIS — O09292 Supervision of pregnancy with other poor reproductive or obstetric history, second trimester: Secondary | ICD-10-CM | POA: Diagnosis not present

## 2014-06-13 DIAGNOSIS — O09293 Supervision of pregnancy with other poor reproductive or obstetric history, third trimester: Secondary | ICD-10-CM

## 2014-06-13 DIAGNOSIS — R9389 Abnormal findings on diagnostic imaging of other specified body structures: Secondary | ICD-10-CM

## 2014-06-14 ENCOUNTER — Ambulatory Visit (HOSPITAL_COMMUNITY): Payer: 59

## 2014-06-30 ENCOUNTER — Encounter (HOSPITAL_COMMUNITY): Payer: Self-pay

## 2014-07-04 LAB — OB RESULTS CONSOLE HIV ANTIBODY (ROUTINE TESTING): HIV: NONREACTIVE

## 2014-07-04 LAB — OB RESULTS CONSOLE RPR: RPR: NONREACTIVE

## 2014-07-07 ENCOUNTER — Other Ambulatory Visit (HOSPITAL_COMMUNITY): Payer: Self-pay

## 2014-07-12 ENCOUNTER — Other Ambulatory Visit (HOSPITAL_COMMUNITY): Payer: Self-pay | Admitting: Maternal and Fetal Medicine

## 2014-07-12 ENCOUNTER — Ambulatory Visit (HOSPITAL_COMMUNITY)
Admission: RE | Admit: 2014-07-12 | Discharge: 2014-07-12 | Disposition: A | Discharge status: Home / Self Care | Payer: 59 | Source: Ambulatory Visit | Attending: Obstetrics and Gynecology | Admitting: Obstetrics and Gynecology

## 2014-07-12 ENCOUNTER — Encounter (HOSPITAL_COMMUNITY): Payer: Self-pay

## 2014-07-12 DIAGNOSIS — O09523 Supervision of elderly multigravida, third trimester: Secondary | ICD-10-CM | POA: Insufficient documentation

## 2014-07-12 DIAGNOSIS — O09293 Supervision of pregnancy with other poor reproductive or obstetric history, third trimester: Secondary | ICD-10-CM

## 2014-07-12 DIAGNOSIS — O350XX0 Maternal care for (suspected) central nervous system malformation in fetus, not applicable or unspecified: Secondary | ICD-10-CM | POA: Insufficient documentation

## 2014-07-12 DIAGNOSIS — O358XX0 Maternal care for other (suspected) fetal abnormality and damage, not applicable or unspecified: Secondary | ICD-10-CM | POA: Insufficient documentation

## 2014-07-12 DIAGNOSIS — O09522 Supervision of elderly multigravida, second trimester: Secondary | ICD-10-CM

## 2014-07-12 DIAGNOSIS — Z3A29 29 weeks gestation of pregnancy: Secondary | ICD-10-CM | POA: Diagnosis not present

## 2014-07-12 DIAGNOSIS — O359XX0 Maternal care for (suspected) fetal abnormality and damage, unspecified, not applicable or unspecified: Secondary | ICD-10-CM

## 2014-07-12 DIAGNOSIS — O3501X0 Maternal care for (suspected) central nervous system malformation or damage in fetus, agenesis of the corpus callosum, not applicable or unspecified: Secondary | ICD-10-CM | POA: Insufficient documentation

## 2014-08-09 ENCOUNTER — Encounter (HOSPITAL_COMMUNITY): Payer: Self-pay

## 2014-08-09 ENCOUNTER — Ambulatory Visit (HOSPITAL_COMMUNITY): Payer: 59

## 2014-08-09 ENCOUNTER — Ambulatory Visit (HOSPITAL_COMMUNITY)
Admission: RE | Admit: 2014-08-09 | Discharge: 2014-08-09 | Disposition: A | Discharge status: Home / Self Care | Payer: 59 | Source: Ambulatory Visit | Attending: Obstetrics and Gynecology | Admitting: Obstetrics and Gynecology

## 2014-08-09 ENCOUNTER — Other Ambulatory Visit (HOSPITAL_COMMUNITY): Payer: Self-pay | Admitting: Maternal and Fetal Medicine

## 2014-08-09 DIAGNOSIS — O359XX0 Maternal care for (suspected) fetal abnormality and damage, unspecified, not applicable or unspecified: Secondary | ICD-10-CM

## 2014-08-09 DIAGNOSIS — O09293 Supervision of pregnancy with other poor reproductive or obstetric history, third trimester: Secondary | ICD-10-CM

## 2014-08-09 DIAGNOSIS — O09522 Supervision of elderly multigravida, second trimester: Secondary | ICD-10-CM | POA: Diagnosis present

## 2014-08-09 DIAGNOSIS — O09299 Supervision of pregnancy with other poor reproductive or obstetric history, unspecified trimester: Secondary | ICD-10-CM | POA: Insufficient documentation

## 2014-08-09 DIAGNOSIS — O09529 Supervision of elderly multigravida, unspecified trimester: Secondary | ICD-10-CM | POA: Insufficient documentation

## 2014-08-26 ENCOUNTER — Telehealth (HOSPITAL_COMMUNITY): Payer: Self-pay | Admitting: MS"

## 2014-08-26 NOTE — Telephone Encounter (Signed)
  Spoke with Ms. Lynita LombardJennifer D [facility] regarding postnatal plan for baby. Baby will be on pediatric teaching service. Spoke with Dr. Erik Obeyeitnauer previously who stated that they would likely plan to do head ultrasound for baby and that they typically would not do an MRI for a baby. Ms. Jeanice LimDurham stated that she has been in contact with [facility] (Dr. Zerita BoersLemmon), and that they would like for the baby have an MRI before leaving the hospital. Ms. Jeanice LimDurham understands that this would [facility] She plans to follow-up with [facility] 1-2 months after delivery with the baby. I discussed with Ms. Goodell that I would relay this information to Dr. Erik Obeyeitnauer, so that individuals with the teaching service would be aware that she has communicated with [facility] and could also put this information in the "sticky note" in her chart. She also inquire about the liver function tests for this baby, given that it was determined that her first child, a son, had a problem with his liver function. Communicated to Ms. Ambriz that per my discussion with Dr. Erik Obeyeitnauer, who is familiar with her history, they would do typical newborn screening for this particular baby. The patient stated that she would be OK with this as long as everybody felt comfortable that the baby was doing OK and that she and her partner felt OK about taking the baby home.   Clydie BraunKaren Riddhi Grether 08/26/2014 9:16 AM

## 2014-08-30 LAB — OB RESULTS CONSOLE GBS: STREP GROUP B AG: NEGATIVE

## 2014-09-27 ENCOUNTER — Telehealth (HOSPITAL_COMMUNITY): Payer: Self-pay | Admitting: *Deleted

## 2014-09-27 NOTE — Telephone Encounter (Signed)
Preadmission screen  

## 2014-09-28 ENCOUNTER — Inpatient Hospital Stay (HOSPITAL_COMMUNITY)
Admission: RE | Admit: 2014-09-28 | Discharge: 2014-10-01 | DRG: 775 | Disposition: A | Discharge status: Home / Self Care | Payer: 59 | Source: Ambulatory Visit | Attending: Obstetrics and Gynecology | Admitting: Obstetrics and Gynecology

## 2014-09-28 ENCOUNTER — Encounter (HOSPITAL_COMMUNITY): Payer: Self-pay

## 2014-09-28 DIAGNOSIS — Z3483 Encounter for supervision of other normal pregnancy, third trimester: Secondary | ICD-10-CM | POA: Diagnosis present

## 2014-09-28 DIAGNOSIS — O09529 Supervision of elderly multigravida, unspecified trimester: Secondary | ICD-10-CM

## 2014-09-28 DIAGNOSIS — O480 Post-term pregnancy: Secondary | ICD-10-CM | POA: Diagnosis present

## 2014-09-28 DIAGNOSIS — Z3A40 40 weeks gestation of pregnancy: Secondary | ICD-10-CM | POA: Diagnosis present

## 2014-09-28 DIAGNOSIS — O09299 Supervision of pregnancy with other poor reproductive or obstetric history, unspecified trimester: Secondary | ICD-10-CM

## 2014-09-28 LAB — CBC
HCT: 36.7 % (ref 36.0–46.0)
Hemoglobin: 12.5 g/dL (ref 12.0–15.0)
MCH: 29.8 pg (ref 26.0–34.0)
MCHC: 34.1 g/dL (ref 30.0–36.0)
MCV: 87.6 fL (ref 78.0–100.0)
PLATELETS: 180 10*3/uL (ref 150–400)
RBC: 4.19 MIL/uL (ref 3.87–5.11)
RDW: 14.7 % (ref 11.5–15.5)
WBC: 10.1 10*3/uL (ref 4.0–10.5)

## 2014-09-28 LAB — TYPE AND SCREEN
ABO/RH(D): O POS
Antibody Screen: NEGATIVE

## 2014-09-28 MED ORDER — ACETAMINOPHEN 325 MG PO TABS: 650.0000 mg | ORAL_TABLET | ORAL | Status: DC | PRN

## 2014-09-28 MED ORDER — LACTATED RINGERS IV SOLN
500.0000 mL | INTRAVENOUS | Status: DC | PRN
  Administered 2014-09-29: 500 mL via INTRAVENOUS

## 2014-09-28 MED ORDER — LACTATED RINGERS IV SOLN
INTRAVENOUS | Status: DC
  Administered 2014-09-28 – 2014-09-29 (×2): via INTRAVENOUS

## 2014-09-28 MED ORDER — LIDOCAINE HCL (PF) 1 % IJ SOLN
INTRAMUSCULAR | Status: AC
  Administered 2014-09-29 (×2): 4 mL
  Filled 2014-09-28: qty 30

## 2014-09-28 MED ORDER — TERBUTALINE SULFATE 1 MG/ML IJ SOLN: 0.2500 mg | Freq: Once | INTRAMUSCULAR | Status: AC | PRN

## 2014-09-28 MED ORDER — OXYTOCIN 10 UNIT/ML IJ SOLN
INTRAMUSCULAR | Status: AC
  Filled 2014-09-28: qty 1

## 2014-09-28 MED ORDER — OXYCODONE-ACETAMINOPHEN 5-325 MG PO TABS: 2.0000 | ORAL_TABLET | ORAL | Status: DC | PRN

## 2014-09-28 MED ORDER — ONDANSETRON HCL 4 MG/2ML IJ SOLN: 4.0000 mg | Freq: Four times a day (QID) | INTRAMUSCULAR | Status: DC | PRN

## 2014-09-28 MED ORDER — OXYCODONE-ACETAMINOPHEN 5-325 MG PO TABS: 1.0000 | ORAL_TABLET | ORAL | Status: DC | PRN

## 2014-09-28 MED ORDER — MISOPROSTOL 25 MCG QUARTER TABLET
25.0000 ug | ORAL_TABLET | ORAL | Status: DC | PRN
  Administered 2014-09-28 – 2014-09-29 (×2): 25 ug via VAGINAL
  Filled 2014-09-28: qty 1
  Filled 2014-09-28: qty 0.25

## 2014-09-28 MED ORDER — CITRIC ACID-SODIUM CITRATE 334-500 MG/5ML PO SOLN: 30.0000 mL | ORAL | Status: DC | PRN

## 2014-09-28 MED ORDER — LIDOCAINE HCL (PF) 1 % IJ SOLN
30.0000 mL | INTRAMUSCULAR | Status: DC | PRN
  Filled 2014-09-28: qty 30

## 2014-09-28 MED ORDER — MISOPROSTOL 25 MCG QUARTER TABLET
ORAL_TABLET | ORAL | Status: AC
  Filled 2014-09-28: qty 0.25

## 2014-09-28 MED ORDER — FENTANYL CITRATE (PF) 100 MCG/2ML IJ SOLN
50.0000 ug | INTRAMUSCULAR | Status: DC | PRN
  Administered 2014-09-29: 100 ug via INTRAVENOUS
  Filled 2014-09-28: qty 2

## 2014-09-28 MED ORDER — OXYTOCIN 40 UNITS IN LACTATED RINGERS INFUSION - SIMPLE MED: 62.5000 mL/h | INTRAVENOUS | Status: DC

## 2014-09-28 MED ORDER — OXYTOCIN BOLUS FROM INFUSION
500.0000 mL | INTRAVENOUS | Status: DC
  Administered 2014-09-29: 500 mL via INTRAVENOUS

## 2014-09-28 NOTE — Progress Notes (Signed)
Ann LombardJennifer D Roberts, [id number]   Subjective -Patient expresses concerns regarding induction process and effects on fetal brain.  Reassurances given.  Admits to being nervous and hopes for speedy induction process.   Objective Filed Vitals:   09/28/14 2034  BP: 125/87  Pulse: 83  Resp: 16        Physical Exam GU: Vaginal swelling due to varicose veins Thick white discharge noted after SVE  NFA:[id number]:Dilation: 2 Effacement (%): 50 Cervical Position: Posterior Station: -3 Presentation: Vertex Exam by:: Gerrit HeckJessica Anthonny Schiller CNM  Pelvis: -Proven:Yes to 8lbs 20z -Adequate: Yes, DC >13cm Leopolds: -Position:Vertex -EFW:8 1/4-1/2lbs  Monitoring: FHR: 130 bpm, Mod Var, -Decels, +Accels UC:  Irregular  Induction/Augmentation Agent: Pitocin: None Cytotec: 1st Dose at 2145 Membranes: Intact  Assessment IUP at 40.4wks Cat I FT Bishop Score: 4 Cervical Ripening  Plan -Discussed r/b of induction including fetal distress, serial induction, pain, and increased risk of c/s delivery -Discussed induction methods including cervical ripening agents and pitocin -Questions and concerns addressed, Reassurances given -Discussed need for NICU team to be present at delivery -Continue other mgmt as ordered  Seiji Wiswell LYNN, CNM 09/28/2014, 9:43 PM

## 2014-09-28 NOTE — H&P (Signed)
Ann Roberts is a 34 y.o. female, G3P2002 at 40.4 weeks, presenting for induction of labor for postdates. 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  Per patient, infant is to have a MRI performed within 3 days of delivery and [facility].  Patient with a history of neonatal death secondary to infant hepatitis.  Additionally this pregnancy, as well as previous pregnancies, were the result of IVF or IUI.   Patient Active Problem List   Diagnosis Date Noted  . [redacted] weeks gestation of pregnancy   . Neonatal death in prior pregnancy, currently pregnant   . Fetal abnormality affecting management of mother   . AMA (advanced maternal age) multigravida 35+   . [redacted] weeks gestation of pregnancy   . Fetal abnormality affecting management of mother, antepartum condition or complication   . 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 Abnormal ultrasound   . [redacted] weeks gestation of pregnancy   . [redacted] weeks gestation of pregnancy   . Fetal CNS malformation   . Maternal age 36+, multigravida, antepartum   . Hx of neonatal death in prior pregnancy--occurred 2 days after delivery 03/06/2012    History of present pregnancy: Patient entered care at 12.5 weeks.   EDC of 09/24/2014 was established by 7.5wk US on 01/01/2014.   Anatomy scan:  20.5 weeks, with abnormal findings, as below, and an posterior placenta.   Additional US evaluations:   -U/S: BPP 8/8 AFI: 14.90 cm 50th% Posterior placenta cx closed Bilateral ventriculomegaly rt ventricle: 1.5 cm left ventricle: 1.6 cm  -LVEIF: recommend repeat 1 month postnatally Appropriate growth  -ANATOMY SIGNIFICANT FOR LEFT CHOROID PLEXUS CYST, ASSYMETRY OF LATERAL VENTRICLES (L 8.3 MM, R 6.9 MM),CAVUM SEPTUM PELLUCIDUM NOT IDENTIFIED, MIDLINE FALX SHIFT   -34.3wks; U/S: BPP 8/8. AFI  20.30cm -36.3wks; U/S: vertex, BPP 8/8 -37.3wks; BPP U/S 8/8 -38.3wks; BPP 6/8 No breathing -39.3wks;  BPP 8/8. AFI 14.5cm -40.2wks; BPP: 8/8 normal AFI Significant prenatal events:  Patient with common pregnancy discomforts difficulty sleeping, fatigue, back pain, coughing, nasal congestion, and sinus issues.  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  Patient also with vulvar varicosities.   Last evaluation:  09/26/2014  By Dr.S. Rivard  FHR; 140, VE: 2cm / 50% Vertex, BP;108/60, Wt 194 lbs  OB History    Gravida Para Term Preterm AB TAB SAB Ectopic Multiple Living   3 2 2  0 0 0 0 0 0 1    -07/2011: Baby Boy Ann Roberts: 7lbs 4oz--Died 3 days later due to hepatitis -02/2013: Baby Girl Ann Roberts: 8lbs 2oz -2016: Current    Past Medical History  Diagnosis Date  . Infertility, female   . History of cystitis   . No pertinent past medical history   . H/O toxoplasmosis     pt. denies  . H/O varicella   . Hx: UTI (urinary tract infection)     x 1  . Hx: UTI (urinary tract infection)   . Status post epidural steroid injection     ruptured disc L5 and S1   Past Surgical History  Procedure Laterality Date  . Anterior cruciate ligament repair  1996    R  . Wisdom tooth extraction    . Epidural steroids  2007    x2 for ruptured disc in back  . Tonsilectomy, adenoidectomy, bilateral myringotomy  and tubes    . Tonsillectomy  2002   Family History: family history includes Cancer in her maternal grandfather; Dementia in her paternal grandmother; Diabetes in her maternal grandmother; Factor V Leiden deficiency in her paternal aunt; Heart attack in her maternal grandmother; Hypertension in her paternal aunt; Mental illness in her paternal grandmother; Migraines in her father. Social History:  reports that she has never smoked. She has never used smokeless tobacco. She reports that she does not drink alcohol or use illicit drugs.  Patient and husband, Ann NeedleMichael, are both college graduates  employed as Engineer, waterpharmacists.  Patient currently works at [employer] and cares for her daughter, Ann Roberts.   Prenatal Transfer Tool  Maternal Diabetes: No Genetic Screening: Normal Maternal Ultrasounds/Referrals: Abnormal:  Findings:   Isolated choroid plexus cyst, corpus callosum agenesis Fetal Ultrasounds or other Referrals:  Fetal echo, Referred to Materal Fetal Medicine  Maternal Substance Abuse:  No Significant Maternal Medications:  Meds include: Zantac Significant Maternal Lab Results: Lab values include: Group B Strep negative    ROS:  +FM, -LOF, -VB, -Ctx  No Known Allergies     unknown if currently breastfeeding.  Physical Exam  Constitutional: She is oriented to person, place, and time. She appears well-developed and well-nourished. No distress.  HENT:  Head: Normocephalic and atraumatic.  Eyes: EOM are normal.  Neck: Normal range of motion.  Cardiovascular: Normal rate, regular rhythm and normal heart sounds.   Respiratory: Effort normal and breath sounds normal.  GI: Soft. Bowel sounds are normal.  Genitourinary:  Deferred at current  Musculoskeletal: Normal range of motion.  Neurological: She is alert and oriented to person, place, and time.  Skin: Skin is warm and dry.  Psychiatric: She has a normal mood and affect. Her behavior is normal.     FHR: 140 bpm, Mod Var, -Decels, +Accels UCs:  Occasional graphed, palpates mild  Prenatal labs: ABO, Rh: O/Positive/-- (11/25 0000) Antibody: Negative (11/25 0000) Rubella:  Pending RPR: Nonreactive (02/15 0000)  HBsAg:   Pending HIV: Non-reactive (02/15 0000)  GBS: Negative (04/12 0000) Sickle cell/Hgb electrophoresis:  N/A Pap:  Unknown GC:  Negative Chlamydia:  Negative Other:  None    Assessment IUP at 40.4wks Cat I FT Post Dates Fetal Abnormalities GBS Negative  [facility]: [facility] consult with Dr. Carroll SageE. Varnado Routine Labor and Delivery Orders per [facility] Protocol Routine  Augmentation/Induction Orders per [facility] Protocol Discussed infant POC to include NICU presence at delivery Patient desires epidural Patient reports meal prior to arrival Will return to start induction process  Rona Tomson LYNN CNM, MSN 09/28/2014, 5:39 PM

## 2014-09-28 NOTE — Plan of Care (Signed)
Problem: Consults Goal: [facility] Suit[facility] Patient Information Press F2 to bring up selections list Outcome: Completed/Met Date Met:  09/28/14  Pt > [redacted] weeks EGA and Inpatient induction

## 2014-09-29 ENCOUNTER — Inpatient Hospital Stay (HOSPITAL_COMMUNITY): Payer: 59 | Admitting: Anesthesiology

## 2014-09-29 ENCOUNTER — Encounter (HOSPITAL_COMMUNITY): Payer: Self-pay

## 2014-09-29 LAB — RPR: RPR Ser Ql: NONREACTIVE

## 2014-09-29 LAB — HEPATITIS B SURFACE ANTIGEN: Hepatitis B Surface Ag: NEGATIVE

## 2014-09-29 MED ORDER — IBUPROFEN 600 MG PO TABS
600.0000 mg | ORAL_TABLET | Freq: Four times a day (QID) | ORAL | Status: DC
  Administered 2014-09-29 – 2014-10-01 (×9): 600 mg via ORAL
  Filled 2014-09-29 (×9): qty 1

## 2014-09-29 MED ORDER — SIMETHICONE 80 MG PO CHEW
80.0000 mg | CHEWABLE_TABLET | ORAL | Status: DC | PRN
Start: 2014-09-29 — End: 2014-10-01

## 2014-09-29 MED ORDER — PRENATAL MULTIVITAMIN CH
1.0000 | ORAL_TABLET | Freq: Every day | ORAL | Status: DC
  Administered 2014-09-30 – 2014-10-01 (×2): 1 via ORAL
  Filled 2014-09-29 (×2): qty 1

## 2014-09-29 MED ORDER — ONDANSETRON HCL 4 MG PO TABS: 4.0000 mg | ORAL_TABLET | ORAL | Status: DC | PRN

## 2014-09-29 MED ORDER — ZOLPIDEM TARTRATE 5 MG PO TABS: 5.0000 mg | ORAL_TABLET | Freq: Every evening | ORAL | Status: DC | PRN

## 2014-09-29 MED ORDER — EPHEDRINE 5 MG/ML INJ
10.0000 mg | INTRAVENOUS | Status: DC | PRN
  Filled 2014-09-29: qty 2

## 2014-09-29 MED ORDER — SENNOSIDES-DOCUSATE SODIUM 8.6-50 MG PO TABS
2.0000 | ORAL_TABLET | ORAL | Status: DC
  Administered 2014-09-29: 2 via ORAL
  Filled 2014-09-29: qty 2

## 2014-09-29 MED ORDER — FENTANYL 2.5 MCG/ML BUPIVACAINE 1/10 % EPIDURAL INFUSION (WH - ANES)
14.0000 mL/h | INTRAMUSCULAR | Status: DC | PRN
  Administered 2014-09-29 (×2): 14 mL/h via EPIDURAL
  Filled 2014-09-29: qty 125

## 2014-09-29 MED ORDER — DIPHENHYDRAMINE HCL 50 MG/ML IJ SOLN: 12.5000 mg | INTRAMUSCULAR | Status: DC | PRN

## 2014-09-29 MED ORDER — PHENYLEPHRINE 40 MCG/ML (10ML) SYRINGE FOR IV PUSH (FOR BLOOD PRESSURE SUPPORT)
80.0000 ug | PREFILLED_SYRINGE | INTRAVENOUS | Status: DC | PRN
  Administered 2014-09-29: 80 ug via INTRAVENOUS
  Filled 2014-09-29: qty 2
  Filled 2014-09-29: qty 20

## 2014-09-29 MED ORDER — BENZOCAINE-MENTHOL 20-0.5 % EX AERO: 1.0000 "application " | INHALATION_SPRAY | CUTANEOUS | Status: DC | PRN

## 2014-09-29 MED ORDER — ONDANSETRON HCL 4 MG/2ML IJ SOLN: 4.0000 mg | INTRAMUSCULAR | Status: DC | PRN

## 2014-09-29 MED ORDER — DIBUCAINE 1 % RE OINT: 1.0000 "application " | TOPICAL_OINTMENT | RECTAL | Status: DC | PRN

## 2014-09-29 MED ORDER — OXYCODONE-ACETAMINOPHEN 5-325 MG PO TABS: 2.0000 | ORAL_TABLET | ORAL | Status: DC | PRN

## 2014-09-29 MED ORDER — WITCH HAZEL-GLYCERIN EX PADS: 1.0000 "application " | MEDICATED_PAD | CUTANEOUS | Status: DC | PRN

## 2014-09-29 MED ORDER — LANOLIN HYDROUS EX OINT: TOPICAL_OINTMENT | CUTANEOUS | Status: DC | PRN

## 2014-09-29 MED ORDER — OXYCODONE-ACETAMINOPHEN 5-325 MG PO TABS: 1.0000 | ORAL_TABLET | ORAL | Status: DC | PRN

## 2014-09-29 MED ORDER — ACETAMINOPHEN 325 MG PO TABS: 650.0000 mg | ORAL_TABLET | ORAL | Status: DC | PRN

## 2014-09-29 MED ORDER — TETANUS-DIPHTH-ACELL PERTUSSIS 5-2.5-18.5 LF-MCG/0.5 IM SUSP: 0.5000 mL | Freq: Once | INTRAMUSCULAR | Status: DC

## 2014-09-29 MED ORDER — DIPHENHYDRAMINE HCL 25 MG PO CAPS: 25.0000 mg | ORAL_CAPSULE | Freq: Four times a day (QID) | ORAL | Status: DC | PRN

## 2014-09-29 NOTE — Lactation Note (Signed)
This note was copied from the chart of [name]. Lactation Consultation Note  [name] is having difficulty at the breast.  Mom is concerned because her 7419 month old had BF difficulty related to a labial tie and a possible lingual tie.  This baby has a wide fleshy labial frenum and a lingual frenum is detected with a finger sweep.  She was gaggy when I first started her oral assessment but once her posterior palate became used to my finger in her mouth she pulled it deeply into her  Mouth and suckled.  I did her snap Roberts at times and it was also noted that she occasionally lost the seal.  SHe latched to mom's breast after this and suckled briefly.  Mom reported a decrease in pain.  Mom reported that she had been on the opposite breast for 15 minutes and thought that it may have contributed to baby's lack of zeal.  Follow-up tomorrow.  Information on support groups and outpatient services given to mom.  Patient Name: [name] Today's Date: 09/29/2014 Reason for consult: Initial assessment   Maternal Data Has patient been taught Hand Expression?: Yes  Feeding Feeding Type: Breast Fed Length of feed: 20 min  LATCH Score/Interventions Latch: Repeated attempts needed to sustain latch, nipple held in mouth throughout feeding, stimulation needed to elicit sucking reflex.  Audible Swallowing: A few with stimulation  Type of Nipple: Everted at rest and after stimulation  Comfort (Breast/Nipple): Filling, red/small blisters or bruises, mild/mod discomfort     Hold (Positioning): Assistance needed to correctly position infant at breast and maintain latch.  LATCH Score: 6  Lactation Tools Discussed/Used     Consult Status Consult Status: Follow-up Date: 09/30/14 Follow-up type: In-patient    Soyla DryerJoseph, Waymond Meador 09/29/2014, 6:45 PM

## 2014-09-29 NOTE — Progress Notes (Signed)
  Subjective: Comfortable with epidural.  Objective: BP 119/68 mmHg  Pulse 57  Temp(Src) 97.8 F (36.6 C) (Oral)  Resp 20  Ht 5\' 7"  (1.702 m)  Wt 87.998 kg (194 lb)  BMI 30.38 kg/m2  SpO2 97%      FHT: Category 2--occasional very quick, shallow variables, + scalp stim response, + ST variability noted. UC:   regular, every 2 minutes SVE:   Dilation: 6.5 (7.5 without contraction) Effacement (%): 90 Station: -1, -2 Exam by:: Jera Headings cnm  BBOW--AROM, clear fluid IUPC placed without difficulty Foley inserted without difficulty  Assessment:  Progressive labor  Plan: Continue to observe--await urge to push.  Nigel BridgemanLATHAM, Kaylin Schellenberg CNM 09/29/2014, 8:22 AM

## 2014-09-29 NOTE — Progress Notes (Signed)
  Subjective: Epidural just placed, patient getting comfortable.  Objective: BP 113/72 mmHg  Pulse 59  Temp(Src) 97.8 F (36.6 C) (Oral)  Resp 20  Ht 5\' 7"  (1.702 m)  Wt 87.998 kg (194 lb)  BMI 30.38 kg/m2  SpO2 97%      FHT: Category 2--no decels, but decreased variability.  Received Fentanyl at [phone] UC:   regular, every 2 minutes SVE:   Dilation: 4 Effacement (%): 90 Station: -1, -2 Exam by:: TWillis RNC at [phone]   Assessment:  Induction for postdates, now in active labor. Fetus with corpus callosum agenesis, ventriculomegaly of head. 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 Plan recheck when epidural settled. Anticipate AROM--does not need pitocin at present. Plan NICU team presence at delivery.  Nigel BridgemanLATHAM, Jasime Westergren CNM 09/29/2014, 8:02 AM

## 2014-09-29 NOTE — Progress Notes (Signed)
[facility]ennifer [facility] MR[facility]: [id number]  Subjective: -Patient asleep in bed. Easily awakened with light touch.  Reports perception of contractions as "cramping."  Objective: BP 127/84 mmHg  Pulse 88  Temp(Src) 97.9 F (36.6 C) (Oral)  Resp 16  Ht 5\' 7"  (1.702 m)  Wt 87.998 kg (194 lb)  BMI 30.38 kg/m2     FHT: 135 bpm, Mod Var, -Decels, +Accels UC: Q855min, palpates mild to moderate   SVE:   Dilation: 2 Effacement (%): 50 Station: -3 Exam by:: Ann HeckJessica Shardea Roberts CNM Membranes:Intact Pitocin:None Cytotec: 2nd Dose Placed  Assessment:  IUP at 40.5wks Cat I FT  Bishop Score: 4 Cervical Ripening  Plan: -Cytotec placed -Informed of possibility of shower and light meal at 0545 -Encouraged to rest -Continue other mgmt as ordered  Ann Ales LYNN,MSN, CNM 09/29/2014, 1:49 AM

## 2014-09-29 NOTE — Progress Notes (Signed)
  Subjective: Feeling more pressure, but no strong urge to push.  Objective: BP 119/72 mmHg  Pulse 69  Temp(Src) 97.8 F (36.6 C) (Oral)  Resp 20  Ht 5\' 7"  (1.702 m)  Wt 87.998 kg (194 lb)  BMI 30.38 kg/m2  SpO2 97%      FHT: Category 2--sporadic quick variables with UCs, no lates, moderate variability UC:   regular, every 2 minutes SVE:   Dilation: 10 Effacement (%): 90 Station: -1, -2 Exam by:: V Sander Remedios CNM MVUs 205  Assessment:  2nd stage labor--no urge to push  Plan: Await increased urge to push.  Taela Charbonneau CNM 09/29/2014, 9:00 AM

## 2014-09-29 NOTE — Anesthesia Preprocedure Evaluation (Signed)
Anesthesia Evaluation  Patient identified by MRN, date of birth, ID band Patient awake    Reviewed: Allergy & Precautions, NPO status , Patient's Chart, lab work & pertinent test results  History of Anesthesia Complications Negative for: history of anesthetic complications  Airway Mallampati: II  TM Distance: >3 FB Neck ROM: Full    Dental no notable dental hx. (+) Dental Advisory Given   Pulmonary neg pulmonary ROS,  breath sounds clear to auscultation  Pulmonary exam normal       Cardiovascular negative cardio ROS Normal cardiovascular examRhythm:Regular Rate:Normal     Neuro/Psych negative neurological ROS  negative psych ROS   GI/Hepatic negative GI ROS, Neg liver ROS,   Endo/Other  obesity  Renal/GU negative Renal ROS  negative genitourinary   Musculoskeletal negative musculoskeletal ROS (+)   Abdominal   Peds negative pediatric ROS (+)  Hematology negative hematology ROS (+)   Anesthesia Other Findings   Reproductive/Obstetrics (+) Pregnancy                             Anesthesia Physical Anesthesia Plan  ASA: II  Anesthesia Plan: Epidural   Post-op Pain Management:    Induction:   Airway Management Planned:   Additional Equipment:   Intra-op Plan:   Post-operative Plan:   Informed Consent: I have reviewed the patients History and Physical, chart, labs and discussed the procedure including the risks, benefits and alternatives for the proposed anesthesia with the patient or authorized representative who has indicated his/her understanding and acceptance.   Dental advisory given  Plan Discussed with:   Anesthesia Plan Comments:         Anesthesia Quick Evaluation  

## 2014-09-29 NOTE — Consult Note (Signed)
Called to Room 166 by Nigel BridgemanVicki Latham, CNM for delivery of a term infant with abnormal fetal sonogram showing agenesis of the corpus callosum and ventriculomegaly.   Infant was crying vigorously when team arrived so was dismissed right after.    Overton MamMary Ann T Dimaguila, MD (Attending Neonatologist)

## 2014-09-29 NOTE — Anesthesia Procedure Notes (Signed)
Epidural Patient location during procedure: OB  Staffing Anesthesiologist: Mella Inclan Performed by: anesthesiologist   Preanesthetic Checklist Completed: patient identified, site marked, surgical consent, pre-op evaluation, timeout performed, IV checked, risks and benefits discussed and monitors and equipment checked  Epidural Patient position: sitting Prep: site prepped and draped and DuraPrep Patient monitoring: continuous pulse ox and blood pressure Approach: midline Location: L3-L4 Injection technique: LOR saline  Needle:  Needle type: Tuohy  Needle gauge: 17 G Needle length: 9 cm and 9 Needle insertion depth: 5 cm cm Catheter type: closed end flexible Catheter size: 19 Gauge Catheter at skin depth: 10 cm Test dose: negative  Assessment Events: blood not aspirated, injection not painful, no injection resistance, negative IV test and no paresthesia  Additional Notes Patient identified. Risks/Benefits/Options discussed with patient including but not limited to bleeding, infection, nerve damage, paralysis, failed block, incomplete pain control, headache, blood pressure changes, nausea, vomiting, reactions to medication both or allergic, itching and postpartum back pain. Confirmed with bedside nurse the patient's most recent platelet count. Confirmed with patient that they are not currently taking any anticoagulation, have any bleeding history or any family history of bleeding disorders. Patient expressed understanding and wished to proceed. All questions were answered. Sterile technique was used throughout the entire procedure. Please see nursing notes for vital signs. Test dose was given through epidural catheter and negative prior to continuing to dose epidural or start infusion. Warning signs of high block given to the patient including shortness of breath, tingling/numbness in hands, complete motor block, or any concerning symptoms with instructions to call for help. Patient was  given instructions on fall risk and not to get out of bed. All questions and concerns addressed with instructions to call with any issues or inadequate analgesia.      

## 2014-09-30 LAB — CBC
HCT: 30.8 % — ABNORMAL LOW (ref 36.0–46.0)
HEMOGLOBIN: 10.3 g/dL — AB (ref 12.0–15.0)
MCH: 29.3 pg (ref 26.0–34.0)
MCHC: 33.4 g/dL (ref 30.0–36.0)
MCV: 87.5 fL (ref 78.0–100.0)
PLATELETS: 162 10*3/uL (ref 150–400)
RBC: 3.52 MIL/uL — AB (ref 3.87–5.11)
RDW: 14.8 % (ref 11.5–15.5)
WBC: 10 10*3/uL (ref 4.0–10.5)

## 2014-09-30 LAB — RUBELLA ANTIBODY, IGM

## 2014-09-30 NOTE — Progress Notes (Signed)
Ann LombardJennifer D Worth   Subjective: Post Partum Day 1 Vaginal delivery, 1st degree;Periurethral Patient up ad lib, denies syncope or dizziness. Reports consuming regular diet without issues and denies N/V No issues with urination and reports bleeding is appropriate  Feeding:  breast Contraceptive plan:   Unsure Baby to have MRI on Tuesday  Objective: Temp:  [97.7 F (36.5 C)-98.8 F (37.1 C)] 97.9 F (36.6 C) (05/13 0626) Pulse Rate:  [50-86] 79 (05/13 0626) Resp:  [16-20] 18 (05/13 0626) BP: (98-131)/(54-96) 127/71 mmHg (05/13 0626) SpO2:  [97 %-100 %] 100 % (05/13 0626)  Physical Exam:  General: alert and cooperative Ext: WNL, no edema. No evidence of DVT seen on physical exam. Breast: Soft filling Lungs: CTAB Heart RRR without murmur  Abdomen:  Soft, fundus firm, lochia scant, + bowel sounds, non distended, non tender Lochia: appropriate Uterine Fundus: firm Laceration: healing well    Recent Labs  09/28/14 2040 09/30/14 0520  HGB 12.5 10.3*  HCT 36.7 30.8*    Assessment S/P Vaginal Delivery-Day 1 Stable  Normal Involution Breastfeeding  Plan: Continue current care Plan for discharge tomorrow    Blain Hunsucker, CNM, MSN 09/30/2014, 6:50 AM

## 2014-10-01 NOTE — Discharge Instructions (Signed)

## 2014-10-01 NOTE — Discharge Summary (Signed)
Vaginal Delivery Discharge Summary  Ann Roberts  DOB:    05/12/1981 MRN:    [id number] CSN:    [id number]  Date of admission:                  09/28/14  Date of discharge:                   10/01/14  Procedures this admission:   SVB, repair of 1st degree periurethral  Date of Delivery: 09/29/14  Newborn Data:  Live born female  Birth Weight: 8 lb 9.6 oz (3900 g) APGAR: 9, 9  Home with mother. Name: Ann Roberts   History of Present Illness:  Ms. Ann Roberts is a 34 y.o. female, [id number], who presents at 7250w5d weeks gestation. [facility]. She was admitted induction of labor due to post-dates. Her pregnancy has been complicated by:  Patient Active Problem List   Diagnosis Date Noted  . Vaginal delivery 09/29/2014  . Post-dates pregnancy 09/28/2014  . Neonatal death in prior pregnancy, currently pregnant   . AMA (advanced maternal age) multigravida 35+   . 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 Maternal age 36+, multigravida, antepartum   . Hx of neonatal death in prior pregnancy--occurred 2 days after delivery 03/06/2012     Hospital Course:  Admitted 09/28/14 for induction due to postdates.  Baby had been identified to have absence of the corpus collosum and ventriculomegaly, to [facility], with plan for MRI at 3 days pp. Negative GBS. Progressed with cytotech, pitocin, and  AROM . Utilized epidural for pain management.  Delivery was performed by Ann BridgemanVicki Ramaj Roberts, CNM, without complication. Patient and baby tolerated the procedure without difficulty, with periurethral  laceration noted. Infant status was stable and remained in room with mother.  Mother and infant then had an uncomplicated postpartum course, with breastfeeding going well. Mom's physical exam was WNL, and she was discharged home in stable  condition. Contraception plan was to consider IUD, although patient has had fertility issues in the past.   She received adequate benefit from po pain medications, using only Motrin with benefit.  Baby will have outpatient MRI in the upcoming week.   Feeding:  breast  Contraception:  None at present--may consider IUD in future.  Hemoglobin Results:  CBC Latest Ref Rng 09/30/2014 09/28/2014 02/23/2013  WBC 4.0 - 10.5 K/uL 10.0 10.1 17.7(H)  Hemoglobin 12.0 - 15.0 g/dL 10.3(L) 12.5 12.0  Hematocrit 36.0 - 46.0 % 30.8(L) 36.7 34.5(L)  Platelets 150 - 400 K/uL 162 180 154     Discharge Physical Exam:   General: alert Lochia: appropriate Uterine Fundus: firm Incision: healing well DVT Evaluation: No evidence of DVT seen on physical exam. Negative Homan's sign.  Intrapartum Procedures: spontaneous vaginal delivery Postpartum Procedures: none Complications-Operative and Postpartum: 1st degree periurethral  Discharge Diagnoses: Term Pregnancy-delivered  Discharge Information:  Activity:           pelvic rest Diet:                routine Medications: Ibuprofen Condition:      stable Instructions:     Discharge to: home  Follow-up Information    [facility]. Schedule an appointment as soon as possible for a visit in 6 weeks.   Specialty:  Obstetrics and Gynecology  Why:  Call for any questions or concerns.   Contact information:   [address] [place] [id number] [phone]       Ann BridgemanLATHAM, Ann Omlor [facility] 10/01/2014 9:23 AM

## 2014-10-05 NOTE — Anesthesia Postprocedure Evaluation (Signed)
  Anesthesia Post-op Note  [facility]  Procedure(s) Performed: Lumbar Epidural for L&D  Complications: No apparent anesthesia complications

## 2015-03-17 ENCOUNTER — Ambulatory Visit (HOSPITAL_COMMUNITY)
Admission: RE | Admit: 2015-03-17 | Discharge: 2015-03-17 | Disposition: A | Discharge status: Home / Self Care | Payer: 59 | Source: Ambulatory Visit | Attending: Obstetrics and Gynecology | Admitting: Obstetrics and Gynecology

## 2015-03-17 NOTE — Lactation Note (Signed)
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 Gained 4 pounds while on Prednisone in about 1 month Consult:  Initial Lactation Consultant:  Ann Roberts, Ann Roberts  ________________________________________________________________________    ________________________________________________________________________  Mother's Name: Ann BassetJennifer Roberts Roberts Type of delivery:   Breastfeeding Experience:  P3- first baby passed away at 3 days, second nursed for 6 months ________________________________________________________________________  Breastfeeding History (Post Discharge)  Frequency of breastfeeding:  q 3 hours during the day, sleeps about 10 hours at night Duration of feeding:  30 min  Supplementation   Breastmilk:  Volume 4 oz Frequency:  3 times/day while mom is at work  Method:  Bottle,   Pumping  Type of pump:  Medela pump in style Frequency:  2 times at work Volume:  3-4 oz   Infant Intake and Output Assessment  Voids:  QS in 24 hrs.  Color:  Clear yellow Stools:  QS in 24 hrs.  Color:  Yellow  ________________________________________________________________________  Maternal Breast Assessment  Breast:  Filling Nipple:  Erect   _______________________________________________________________________ Feeding Assessment/Evaluation  Initial feeding assessment:  Infant's oral assessment:  WNL  Positioning:  Cradle Right breast  LATCH documentation:  Latch:  2 = Grasps breast easily, tongue down, lips flanged, rhythmical sucking.  Audible swallowing:  2 = Spontaneous and intermittent  Type of nipple:  2 = Everted at rest and after stimulation  Comfort (Breast/Nipple):  2 = Soft / non-tender  Hold (Positioning):  2 = No assistance needed to correctly position infant at breast  LATCH score:   10  Attached assessment:  Deep  Lips flanged:  Yes.    Lips untucked:  No.  Suck assessment:  Displays both- nutriitive for the first few minutes   Pre-feed weight:  8174 g  (18 lb. 0.3 oz.) Post-feed weight:  3218 g (18 lb. 1.9 oz.) Amount transferred:  44 ml Amount supplemented:  0 ml  Ann Roberts nursed for 10 min then getting very wiggly. Mom reports she often does this usually when she is finished on that side but she does not let go of breast.    Pre-feed weight:  8219 g  (18 lb. 1.9 oz.) Post-feed weight:  3264 g (18 lb. 3.5 oz.) Amount transferred:  46 ml Amount supplemented:  0 ml  Ann Roberts nursed for 15 min then getting wiggly. Mom reports breasts feel softer. My concern is that she has not gained any weight in 2 Ann Roberts since she went off the Prednisone- is this due to she gained so much while on the Prednisone and she is just leveling off?? Mom reports she usually takes about 4 oz at a feeding with the bottle when mom is working. Exclusive breast feeding when mom is home with her. Concerns about milk supply- she is only able to pump 2 times while at work and Ann Roberts usually feeds 3 times. Suggested pumping once a day before mom goes to bed (Baby usually goes to sleep about 7 pm) for extra stimulation. S[place]ed [place] at [place]in [place] in the next week or 10 days to follow weight gain. No further questions at present. To call prn or if wants another appointment. [facility].   Total amount transferred:  90 ml

## 2015-03-31 ENCOUNTER — Telehealth: Payer: 59 | Admitting: Nurse Practitioner

## 2015-03-31 DIAGNOSIS — H109 Unspecified conjunctivitis: Secondary | ICD-10-CM | POA: Diagnosis not present

## 2015-03-31 MED ORDER — OFLOXACIN 0.3 % OT SOLN: 5.0000 [drp] | Freq: Every day | OTIC | Status: DC

## 2015-03-31 NOTE — Progress Notes (Signed)
We are sorry that you are not feeling well.  Here is how we plan to help!  Based on what you have shared with me it looks like you have conjunctivitis.  Conjunctivitis is a common inflammatory or infectious condition of the eye that is often referred to as "pink eye".  In most cases it is contagious (viral or bacterial). However, not all conjunctivitis requires antibiotics (ex. Allergic).  We have made appropriate suggestions for you based upon your presentation.  I have prescribed Oflaxacin 1-2 drops 4 times a day times 5 days   Pink eye can be highly contagious.  It is typically spread through direct contact with secretions, or contaminated objects or surfaces that one may have touched.  Strict handwashing is suggested with soap and water is urged.  If not available, use alcohol based had sanitizer.  Avoid unnecessary touching of the eye.  If you wear contact lenses, you will need to refrain from wearing them until you see no white discharge from the eye for at least 24 hours after being on medication.  You should see symptom improvement in 1-2 days after starting the medication regimen.  Call us if symptoms are not improved in 1-2 days.  If your cough and congestion is not getting better please submit a new e visit and we can address that then.  Home Care:  Wash your hands often!  Do not wear your contacts until you complete your treatment plan.  Avoid sharing towels, bed linen, personal items with a person who has pink eye.  See attention for anyone in your home with similar symptoms.  Get Help Right Away If:  Your symptoms do not improve.  You develop blurred or loss of vision.  Your symptoms worsen (increased discharge, pain or redness)  Your e-visit answers were reviewed by a board certified advanced clinical practitioner to complete your personal care plan.  Depending on the condition, your plan could have included both over the counter or prescription medications.  If there is  a problem please reply  once you have received a response from your provider.  Your safety is important to us.  If you have drug allergies check your prescription carefully.    You can use MyChart to ask questions about today's visit, request a non-urgent call back, or ask for a work or school excuse for 24 hours related to this e-Visit. If it has been greater than 24 hours you will need to follow up with your provider, or enter a new e-Visit to address those concerns.   You will get an e-mail in the next two days asking about your experience.  I hope that your e-visit has been valuable and will speed your recovery. Thank you for using e-visits.

## 2015-04-01 IMAGING — US US OB FOLLOW-UP
1 series · 12 of 28 positions shown · non-contrast
Comparison: none

[Series 1: us ob follow-up · 0.21mm/px · 12 of 55 slices shown]
[im 3/55]
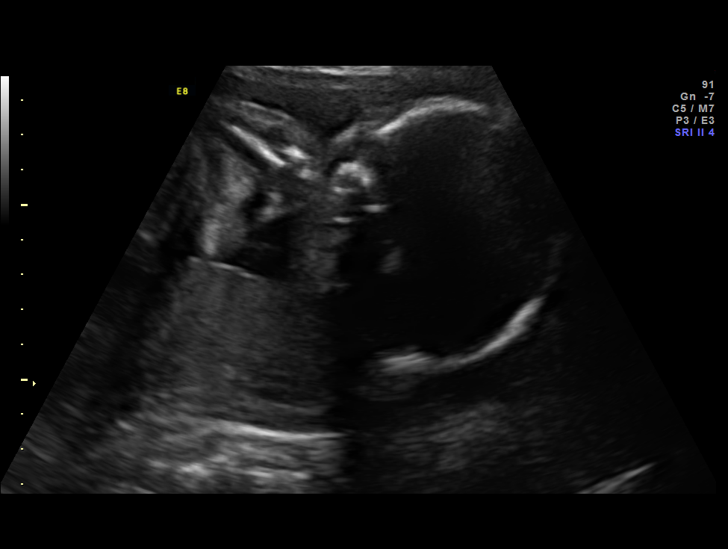
[im 7/55]
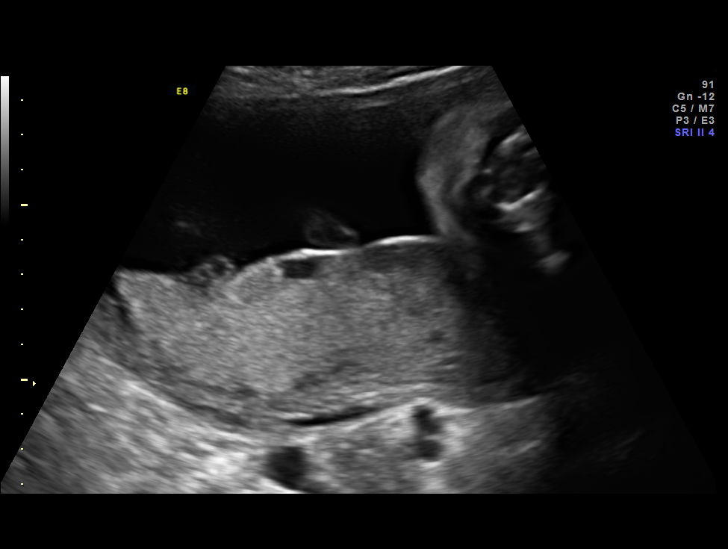
[im 11/55]
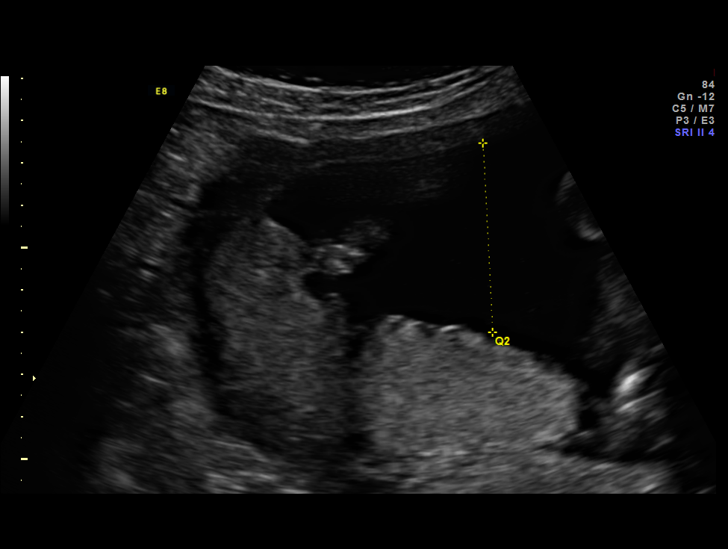
[im 17/55]
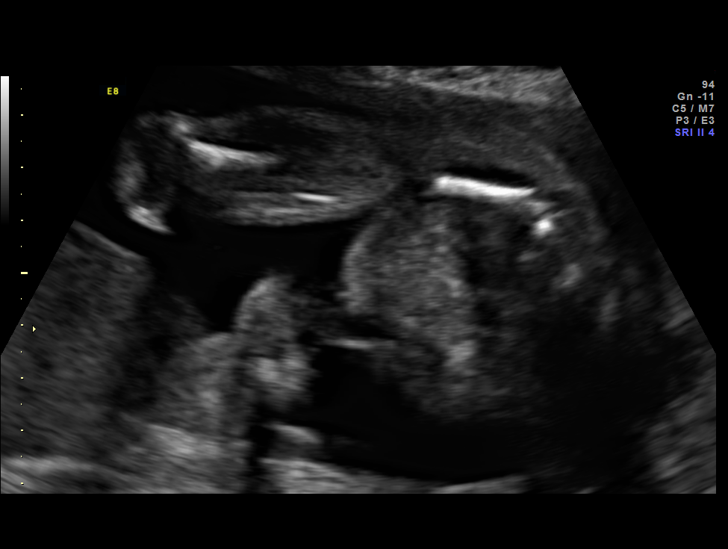
[im 21/55]
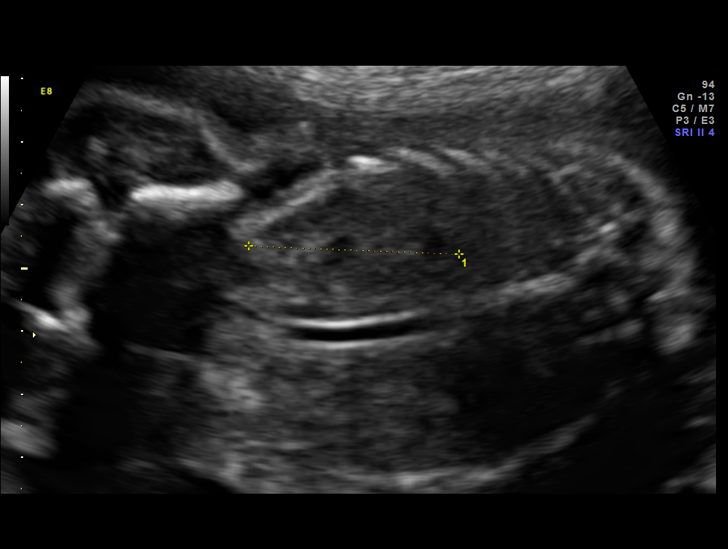
[im 25/55]
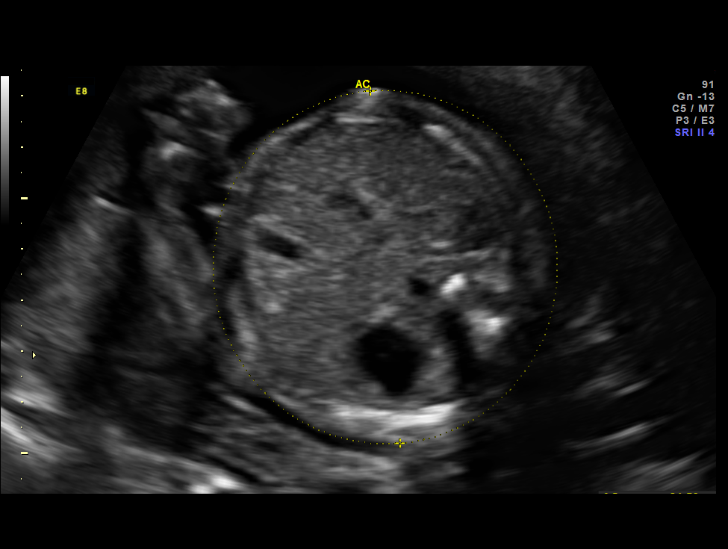
[im 31/55]
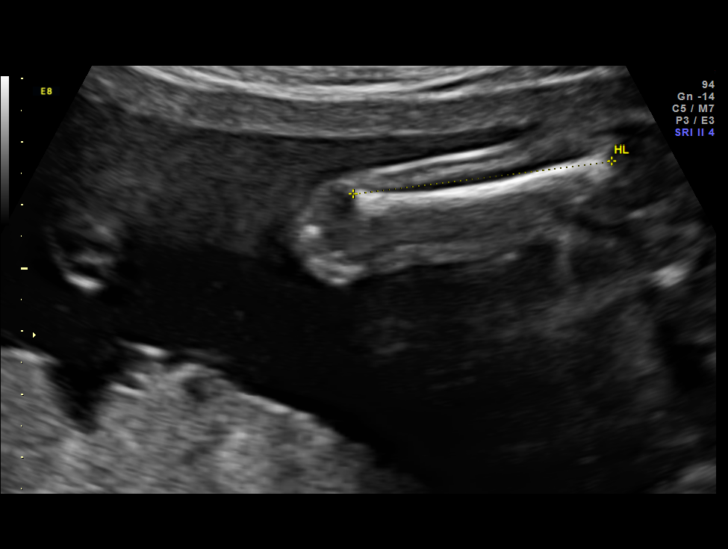
[im 35/55]
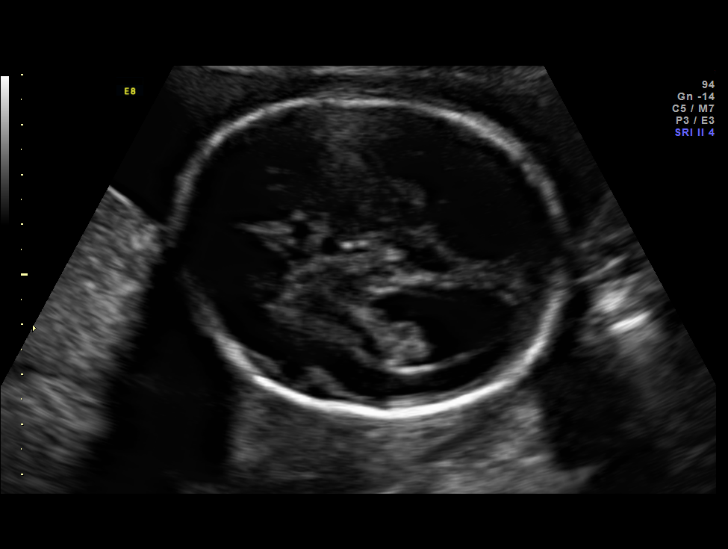
[im 39/55]
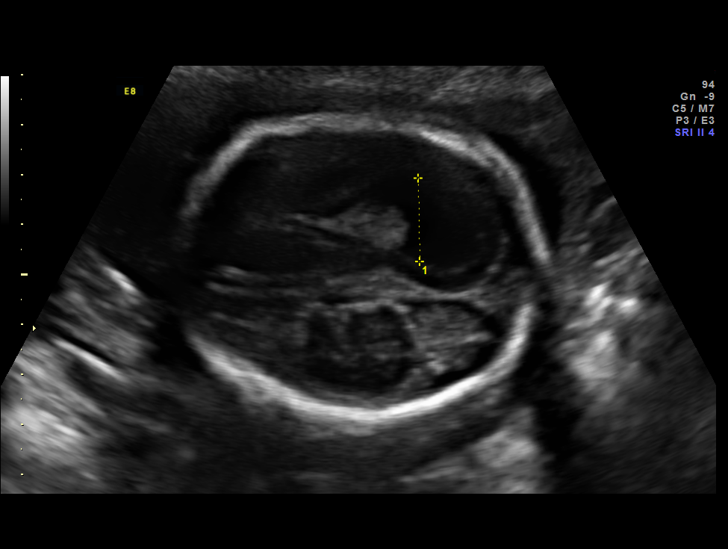
[im 45/55]
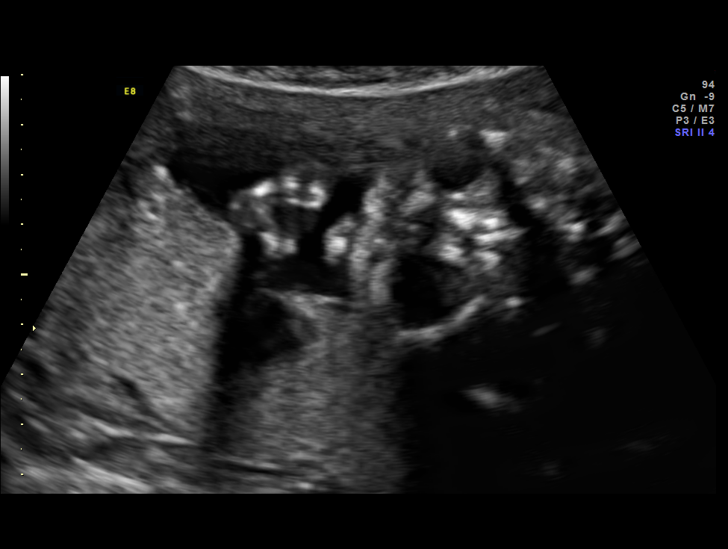
[im 49/55]
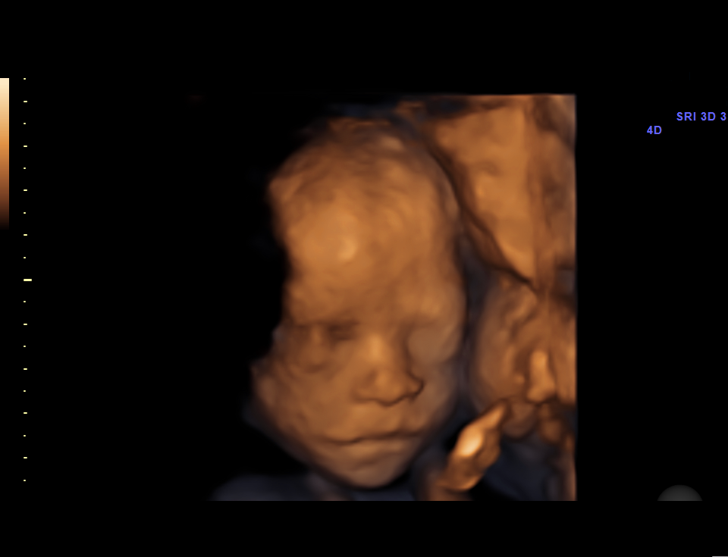
[im 53/55]
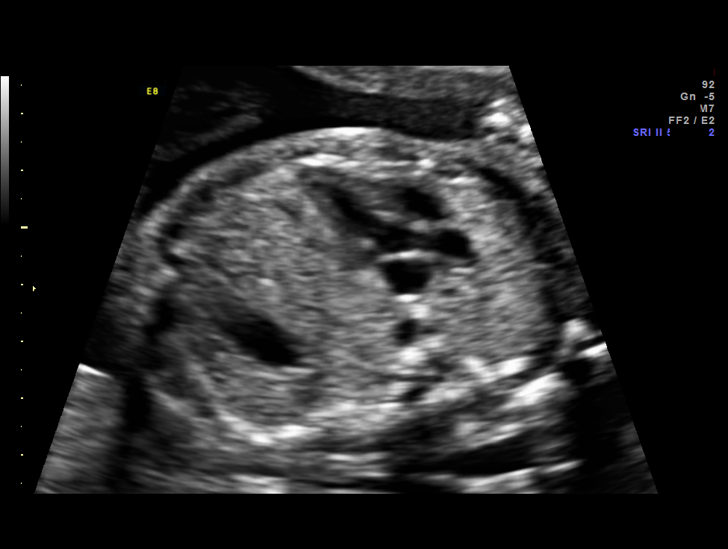

[12 of 28 positions shown; findings below may reference images not displayed]

OBSTETRICS REPORT
                      (Signed Final 06/13/2014 [DATE])

Service(s) Provided

 US OB FOLLOW UP                                       76816.1
Indications

 Advanced maternal age multigravida 35+, second
 trimester-low risk NIPS
 Poor obstetric history: Previous neonatal death
 Fetal abnormality - other known or suspected
 (CNS)
 25 weeks gestation of pregnancy
Fetal Evaluation

 Num Of Fetuses:    1
 Fetal Heart Rate:  150                          bpm
 Cardiac Activity:  Observed
 Presentation:      Cephalic
 Placenta:          Posterior, above cervical
                    os
 P. Cord            Previously Visualized
 Insertion:

 Amniotic Fluid
 AFI FV:      Subjectively within normal limits
 AFI Sum:     16.17   cm       59  %Tile     Larg Pckt:     4.6  cm
 RUQ:   4.6     cm   RLQ:    3.25   cm    LUQ:   4.48    cm   LLQ:    3.84   cm
Biometry

 BPD:     62.1  mm     G. Age:  25w 2d                CI:         76.1   70 - 86
 OFD:     81.6  mm                                    FL/HC:      19.1   18.7 -

 HC:     229.2  mm     G. Age:  24w 6d       19  %    HC/AC:      1.07   1.04 -

 AC:       214  mm     G. Age:  25w 6d       60  %    FL/BPD:     70.4   71 - 87
 FL:      43.7  mm     G. Age:  24w 3d       13  %    FL/AC:      20.4   20 - 24
 HUM:     41.3  mm     G. Age:  25w 0d       36  %

 Est. FW:     784  gm    1 lb 12 oz      52  %
Gestational Age

 LMP:           25w 2d        Date:  12/18/13                 EDD:   09/24/14
 U/S Today:     25w 1d                                        EDD:   09/25/14
 Best:          25w 2d     Det. By:  LMP  (12/18/13)          EDD:   09/24/14
Anatomy

 Cranium:          Previously seen        Aortic Arch:      Previously seen
 Fetal Cavum:      Abnormal, see          Ductal Arch:      Previously seen
                   comments
 Ventricles:       Colpocephaly           Diaphragm:        Appears normal
 Choroid Plexus:   Left choroid           Stomach:          Appears normal, left
                   plexus cyst,
                   3.5mm
                                                            sided

 Cerebellum:       Previously seen        Abdomen:          Appears normal
 Posterior Fossa:  Previously seen        Abdominal Wall:   Appears nml (cord
                                                            insert, abd wall)
 Nuchal Fold:      Not applicable (>20    Cord Vessels:     Appears normal (3
                   wks GA)                                  vessel cord)
 Face:             Appears normal         Kidneys:          Appear normal
                   (orbits and profile)
 Lips:             Appears normal         Bladder:          Appears normal
 Heart:            Appears normal         Spine:            Previously seen
                   (4CH, axis, and
                   situs)
 RVOT:             Appears normal         Lower             Previously seen
                                          Extremities:
 LVOT:             Appears normal         Upper             Previously seen
                                          Extremities:

 Other:  Female gender. Heels and Lt 5th digit appear normal.
Cervix Uterus Adnexa

 Cervical Length:    3.3      cm

 Cervix:       Normal appearance by transabdominal scan. Appears
               closed, without funnelling.
Impression

 Single IUP at 25w 2d
 Normal fetal echo, NIPS - low risk for aneuploidy
 Bilateral cerebral ventriculomegaly / colpocephaly again noted
 Absence CSP- suspicious for agenesis of the corpus callosum
 The remainder of the fetal anatomy appears normal
 Fetal growth is appropriate (52nd %tile)
 Normal amniotic fluid volume
Recommendations

 Fetal MRI scheduled (
29-30 weeks gestation)
 Recommend follow-up ultrasound examination in 4 weeks for
 interval growth and reevaluation

## 2015-06-02 MED FILL — DOXYCYCLINE HYCLATE 100 MG: 100 | 10 days supply | Qty: 20 | Fill #0

## 2015-06-02 MED FILL — CLINDAMYCIN PH 1% GEL: 1 | 30 days supply | Qty: 60 | Fill #0

## 2015-06-27 ENCOUNTER — Other Ambulatory Visit: Payer: Self-pay | Admitting: Orthopedic Surgery

## 2015-06-27 ENCOUNTER — Other Ambulatory Visit (HOSPITAL_COMMUNITY): Payer: Self-pay | Admitting: Orthopedic Surgery

## 2015-06-27 DIAGNOSIS — M25561 Pain in right knee: Secondary | ICD-10-CM | POA: Diagnosis not present

## 2015-06-27 DIAGNOSIS — G8929 Other chronic pain: Secondary | ICD-10-CM | POA: Diagnosis not present

## 2015-06-27 DIAGNOSIS — M25562 Pain in left knee: Secondary | ICD-10-CM | POA: Diagnosis not present

## 2015-07-07 DIAGNOSIS — L732 Hidradenitis suppurativa: Secondary | ICD-10-CM | POA: Diagnosis not present

## 2015-07-07 DIAGNOSIS — Z0000 Encounter for general adult medical examination without abnormal findings: Secondary | ICD-10-CM | POA: Diagnosis not present

## 2015-07-07 DIAGNOSIS — Z6824 Body mass index (BMI) 24.0-24.9, adult: Secondary | ICD-10-CM | POA: Diagnosis not present

## 2015-07-11 DIAGNOSIS — L732 Hidradenitis suppurativa: Secondary | ICD-10-CM | POA: Diagnosis not present

## 2015-07-13 ENCOUNTER — Ambulatory Visit (HOSPITAL_COMMUNITY)
Admission: RE | Admit: 2015-07-13 | Discharge: 2015-07-13 | Disposition: A | Discharge status: Home / Self Care | Payer: 59 | Source: Ambulatory Visit | Attending: Orthopedic Surgery | Admitting: Orthopedic Surgery

## 2015-07-13 DIAGNOSIS — M2242 Chondromalacia patellae, left knee: Secondary | ICD-10-CM | POA: Insufficient documentation

## 2015-07-13 DIAGNOSIS — M1711 Unilateral primary osteoarthritis, right knee: Secondary | ICD-10-CM | POA: Insufficient documentation

## 2015-07-13 DIAGNOSIS — M25561 Pain in right knee: Secondary | ICD-10-CM | POA: Insufficient documentation

## 2015-07-13 DIAGNOSIS — M25562 Pain in left knee: Secondary | ICD-10-CM | POA: Insufficient documentation

## 2015-07-17 DIAGNOSIS — M94262 Chondromalacia, left knee: Secondary | ICD-10-CM | POA: Diagnosis not present

## 2015-07-17 DIAGNOSIS — M94261 Chondromalacia, right knee: Secondary | ICD-10-CM | POA: Diagnosis not present

## 2015-07-25 MED FILL — TARON-PREX PRENATAL DHA CAP: 30-1.2-265 | 30 days supply | Qty: 30 | Fill #0

## 2015-08-10 ENCOUNTER — Ambulatory Visit: Payer: 59 | Attending: Orthopedic Surgery

## 2015-08-10 DIAGNOSIS — M25569 Pain in unspecified knee: Secondary | ICD-10-CM | POA: Diagnosis not present

## 2015-08-10 DIAGNOSIS — M6289 Other specified disorders of muscle: Secondary | ICD-10-CM | POA: Insufficient documentation

## 2015-08-10 DIAGNOSIS — R29898 Other symptoms and signs involving the musculoskeletal system: Secondary | ICD-10-CM

## 2015-08-10 DIAGNOSIS — M25561 Pain in right knee: Secondary | ICD-10-CM | POA: Insufficient documentation

## 2015-08-10 DIAGNOSIS — M228X9 Other disorders of patella, unspecified knee: Secondary | ICD-10-CM

## 2015-08-10 DIAGNOSIS — M25562 Pain in left knee: Secondary | ICD-10-CM | POA: Insufficient documentation

## 2015-08-10 NOTE — Patient Instructions (Signed)
Management of tape to remove if irritating and keep on 3-4 days if not. Quad sets 5-10 reps 2-3x/day , hamstring stretch in sitting 30 sec or more 2-3x/day. She declined handout.

## 2015-08-10 NOTE — Therapy (Addendum)
[facility] [place], [place], [address] Phone: [phone]   Fax:  [phone]  Physical Therapy Evaluation  Patient Details  Name: Ann Roberts MRN: [id number] Date of Birth: August 05, 1980 Referring Provider: Lara Mulch, MD  Encounter Date: 08/10/2015      PT End of Session - 08/10/15 1135    Visit Number 1   Number of Visits 12   Date for PT Re-Evaluation 09/21/15   PT Start Time 3300   PT Stop Time 1100   PT Time Calculation (min) 45 min   Activity Tolerance Patient tolerated treatment well   Behavior During Therapy [facility] for tasks assessed/performed      Past Medical History  Diagnosis Date  . Infertility, female   . History of cystitis   . No pertinent past medical history   . H/O toxoplasmosis     pt. denies  . H/O varicella   . Hx: UTI (urinary tract infection)     x 1  . Hx: UTI (urinary tract infection)   . Status post epidural steroid injection     ruptured disc L5 and S1    Past Surgical History  Procedure Laterality Date  . Anterior cruciate ligament repair  1996    R  . Wisdom tooth extraction    . Epidural steroids  2007    x2 for ruptured disc in back  . Tonsilectomy, adenoidectomy, bilateral myringotomy and tubes    . Tonsillectomy  2002    There were no vitals filed for this visit.  Visit Diagnosis:  Arthralgia of both knees - Plan: PT plan of care cert/re-cert  Weakness of both hips - Plan: PT plan of care cert/re-cert  Patellar tracking disorder, unspecified laterality - Plan: PT plan of care cert/re-cert      Subjective Assessment - 08/10/15 1021    Subjective PAst history of RT ACl repair and scar tissue removal and LT MCL tear. Progressively she has had more pain in past couple of years   Limitations --  Squatting   getting up and down.    How long can you sit comfortably? As needed   How long can you stand comfortably? As needed   How long can you walk  comfortably? As needed   Diagnostic tests negative scans   Patient Stated Goals Strengthening exercises   Currently in Pain? No/denies   Pain Score --  Highest level 8/10 a few months ago   Pain Location Knee   Pain Orientation Posterior;Right;Left   Pain Type Chronic pain   Pain Onset More than a month ago   Pain Frequency Intermittent   Aggravating Factors  Squatting and kneeling and getting up and down and stairs at times coming down   Pain Relieving Factors Not doing anything.    Multiple Pain Sites No            [facility] PT Assessment - 08/10/15 1014    Assessment   Medical Diagnosis Bilateral knee OA   Referring Provider Lara Mulch, MD   Onset Date/Surgical Date --  1996 RT ACL repair and  surgery  remove scar 1999 MCL  LT   Next MD Visit After 4 weeks   Prior Therapy PT long in past.    Precautions   Precautions None   Restrictions   Weight Bearing Restrictions No   Balance Screen   Has the patient fallen in the past 6 months No   Has the patient had a decrease in  activity level because of a fear of falling?  No   Is the patient reluctant to leave their home because of a fear of falling?  No   Prior Function   Level of Independence Independent   Cognition   Overall Cognitive Status Within Functional Limits for tasks assessed   ROM / Strength   AROM / PROM / Strength AROM;Strength   AROM   AROM Assessment Site Knee   Right/Left Knee Right;Left   Right Knee Extension 0   Right Knee Flexion 145   Left Knee Extension 0   Left Knee Flexion 145   Strength   Overall Strength Comments Normal LE strength  except glut medius 4+/5   Strength Assessment Site Knee   Right/Left Knee Right;Left   Right Knee Flexion 5/5   Right Knee Extension 5/5   Left Knee Flexion 5/5   Left Knee Extension 5/5   Flexibility   Soft Tissue Assessment /Muscle Length yes   Hamstrings RT 55 Lt 60   Palpation   Patella mobility nornmal but tracks laterally bilaterally   SI assessment   posterior LT ilia                           PT Education - 08/10/15 1124    Education provided Yes   Education Details POC, tape management , QS, ham stretch   Person(s) Educated Patient   Methods Explanation;Demonstration;Tactile cues;Verbal cues   Comprehension Returned demonstration;Verbalized understanding             PT Long Term Goals - 08/10/15 1106    PT LONG TERM GOAL #1   Title She will be independent with all HEP   Time 4   Period Weeks   Status New   PT LONG TERM GOAL #2   Title She will report pain eased 25% or more with daily activity   Time 4   Period Weeks   Status New   PT LONG TERM GOAL #3   Title She will report use of ice for pain control on regular basis.    Time 4   Period Weeks   Status New   PT LONG TERM GOAL #4   Title She will report modifications to normal postures to limit flexion of knees with ADL's   Time 4   Period Weeks   Status New   PT LONG TERM GOAL #5   Title She will report comfort level in managing and progressing HEP   Time 4   Period Weeks   Status New               Plan - 08/10/15 1142    Clinical Impression Statement Ms Janee Morn presents wit posterior kne pain bilaterally and some mild weakness on lateral hips .She reports previous surgeies and injury to both knees. She has more pain with squatting and rising fronm this postion and descent of stairs. Her ROm is normal with some  hamstring tightness RT more than LT. She does not ice or do anything for pain and had exercised in past but less so since she has children.  Sh also tracks laterally in both patella.  She should do better with more VMO strengtha dn taping for tracking.    Pt will benefit from skilled therapeutic intervention in order to improve on the following deficits Decreased activity tolerance;Pain;Decreased strength;Impaired flexibility   Rehab Potential Good   PT Frequency 2x / week   PT Duration  6 weeks  May only need 4 wees   PT  Treatment/Interventions Cryotherapy;Iontophoresis 11m/ml Dexamethasone;Ultrasound;Therapeutic exercise;Manual techniques;Taping;Patient/family education   PT Next Visit Plan Assess tape and retape if helpful, quad exercisee , hamstring exercise stretch   PT Home Exercise Plan QS, ham stretch, ice   Consulted and Agree with Plan of Care Patient         Problem List Patient Active Problem List   Diagnosis Date Noted  . Vaginal delivery 09/29/2014  . Post-dates pregnancy 006-10-16 . Neonatal death in prior pregnancy, currently pregnant   . AMA (advanced maternal age) multigravida 355+  . 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 Maternal age 360+ multigravida, antepartum   . Hx of neonatal death in prior pregnancy--occurred 2 days after delivery 03/06/2012    CDarrel HooverPT 08/10/2015, 11:49 AM  [place] [address]Phone: [phone]  Fax:  [phone] Name: Ann PERFECTOMRN: [id number] of Birth: 902-Jan-1982  PHYSICAL THERAPY DISCHARGE SUMMARY  Visits from Start of Care: 1  Current functional level related to goals / functional outcomes: Ms DGreenleywas called about her appointment today and she requested discharge as she is better with her HEP and is busy with her child and is unable to make many appointments due to conflicts   Remaining deficits: Per her report she is doing better   Education / Equipment: HEP Plan: Patient agrees to discharge.  Patient goals were partially met. Patient is being discharged due to being pleased with the current functional level.  ?????   SDarrel Hoover PT                    09/07/15                          2:10 PM

## 2015-08-22 DIAGNOSIS — N938 Other specified abnormal uterine and vaginal bleeding: Secondary | ICD-10-CM | POA: Diagnosis not present

## 2015-09-12 ENCOUNTER — Other Ambulatory Visit: Payer: Self-pay | Admitting: Nurse Practitioner

## 2015-09-12 NOTE — Telephone Encounter (Signed)
Denied- will have to submit evisit or see PCP

## 2015-09-12 NOTE — Telephone Encounter (Signed)
Spoke with patient, she is unsure why we received this refill request.  She has contacted [facility] and they think possibly someone else was sending in a refill request and may have hit a wrong number inadvertantly.

## 2015-09-27 ENCOUNTER — Telehealth: Payer: 59 | Admitting: Nurse Practitioner

## 2015-09-27 DIAGNOSIS — J0100 Acute maxillary sinusitis, unspecified: Secondary | ICD-10-CM | POA: Diagnosis not present

## 2015-09-27 MED ORDER — AZITHROMYCIN 250 MG PO TABS: ORAL_TABLET | ORAL | Status: DC

## 2015-09-27 NOTE — Progress Notes (Signed)

## 2015-12-14 MED FILL — DOXYCYCLINE HYCLATE 100 MG: 100 | 30 days supply | Qty: 60 | Fill #0

## 2016-01-04 DIAGNOSIS — H5213 Myopia, bilateral: Secondary | ICD-10-CM | POA: Diagnosis not present

## 2016-01-04 DIAGNOSIS — H04123 Dry eye syndrome of bilateral lacrimal glands: Secondary | ICD-10-CM | POA: Diagnosis not present

## 2016-01-17 DIAGNOSIS — Z319 Encounter for procreative management, unspecified: Secondary | ICD-10-CM | POA: Diagnosis not present

## 2016-04-16 MED FILL — TARON-PREX PRENATAL DHA CAP: 30-1.2-265 | 30 days supply | Qty: 30 | Fill #1

## 2016-04-17 DIAGNOSIS — N912 Amenorrhea, unspecified: Secondary | ICD-10-CM | POA: Diagnosis not present

## 2016-04-17 DIAGNOSIS — Z8279 Family history of other congenital malformations, deformations and chromosomal abnormalities: Secondary | ICD-10-CM | POA: Diagnosis not present

## 2016-04-17 DIAGNOSIS — O3680X0 Pregnancy with inconclusive fetal viability, not applicable or unspecified: Secondary | ICD-10-CM | POA: Diagnosis not present

## 2016-04-25 DIAGNOSIS — Z3491 Encounter for supervision of normal pregnancy, unspecified, first trimester: Secondary | ICD-10-CM | POA: Diagnosis not present

## 2016-04-25 LAB — OB RESULTS CONSOLE RUBELLA ANTIBODY, IGM: Rubella: IMMUNE

## 2016-04-25 LAB — OB RESULTS CONSOLE GC/CHLAMYDIA
CHLAMYDIA, DNA PROBE: NEGATIVE
GC PROBE AMP, GENITAL: NEGATIVE

## 2016-04-25 LAB — OB RESULTS CONSOLE HEPATITIS B SURFACE ANTIGEN: HEP B S AG: NEGATIVE

## 2016-04-25 LAB — OB RESULTS CONSOLE RPR: RPR: NONREACTIVE

## 2016-04-25 LAB — OB RESULTS CONSOLE HIV ANTIBODY (ROUTINE TESTING): HIV: NONREACTIVE

## 2016-04-30 IMAGING — MR MR KNEE*L* W/O CM
4 of 6 series · 19 of 40 positions shown · non-contrast
Comparison: None.

CLINICAL DATA: Left knee pain which is worse with bending for
approximately 2 years. No known injury. Initial encounter.

EXAM:
MRI OF THE LEFT KNEE WITHOUT CONTRAST
TECHNIQUE: Multiplanar, multisequence MR imaging of the knee was performed. No
intravenous contrast was administered.

[Series 3: PD fat-sat · axial · 3.0mm · 0.29mm/px · z∈[-78,+41]mm · 8 of 35 slices shown (1 of 4)]
[im 1/35]
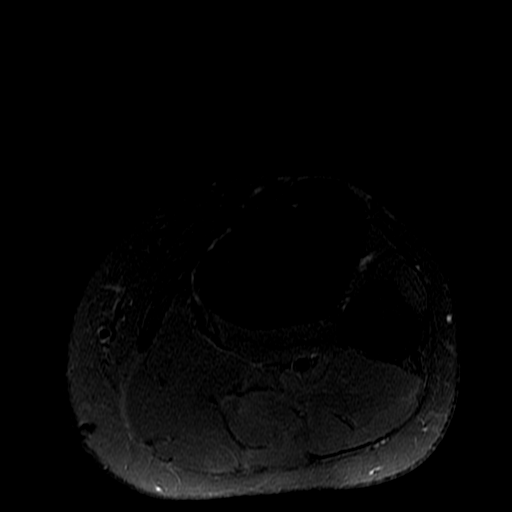
[im 5/35]
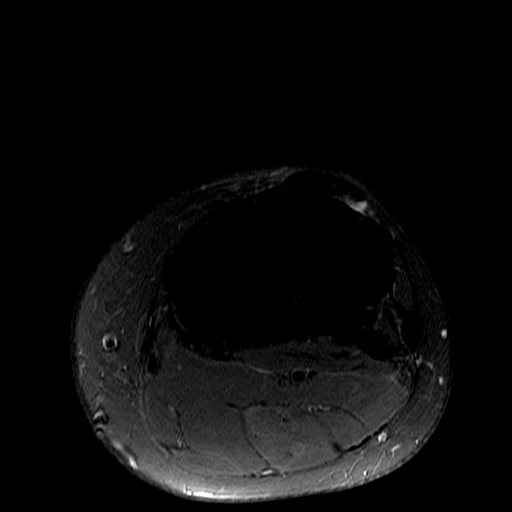
[im 10/35]
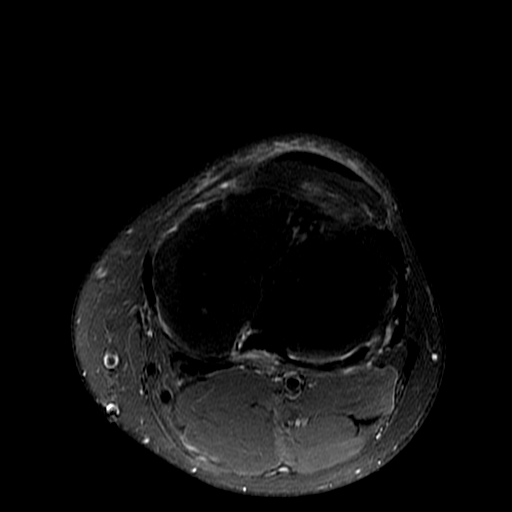
[im 15/35]
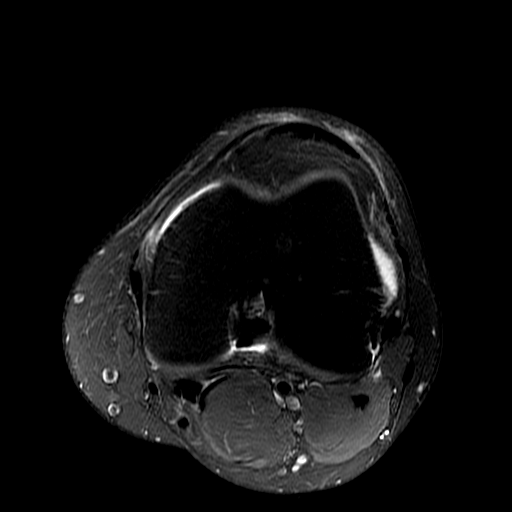
[im 20/35]
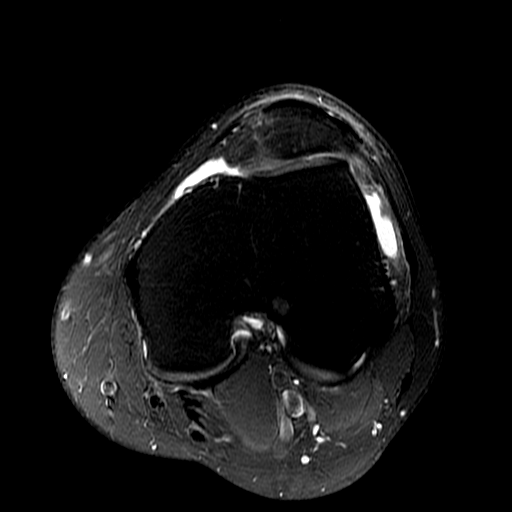
[im 25/35]
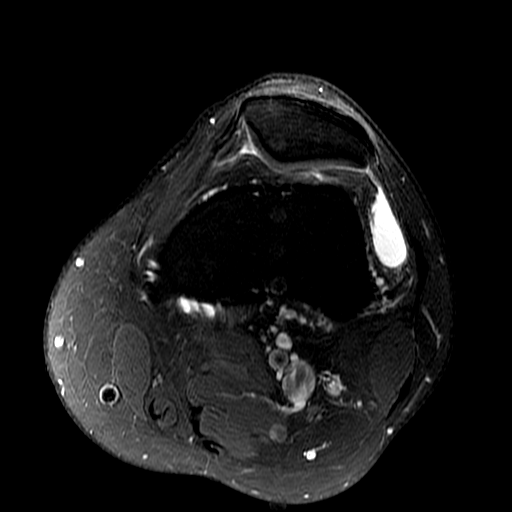
[im 30/35]
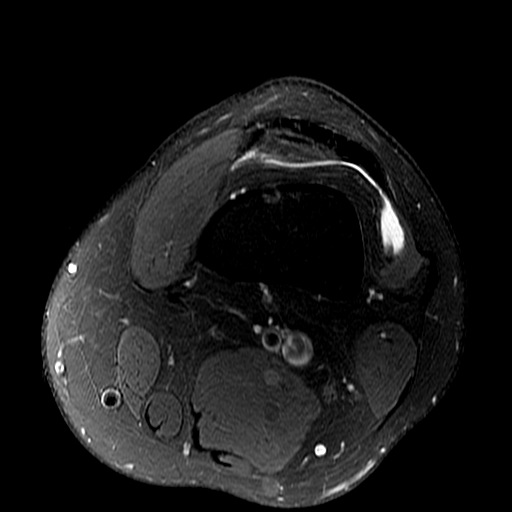
[im 35/35]
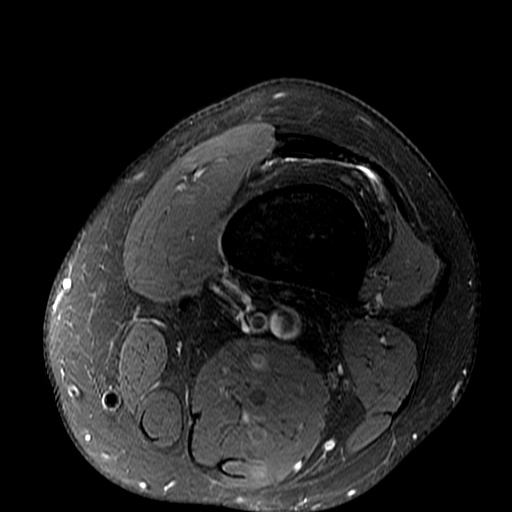

[Series 5: PD fat-sat · coronal · 3.0mm · 0.29mm/px · 5 of 31 slices shown (2 of 4)]
[im 1/31]
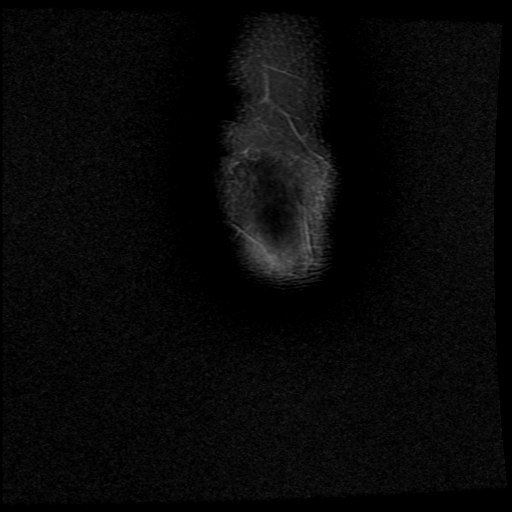
[im 6/31]
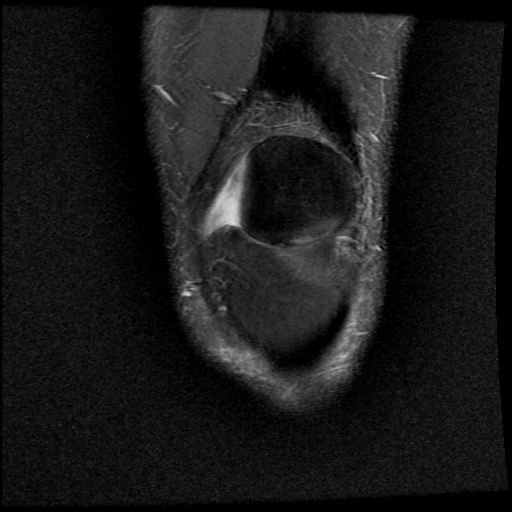
[im 11/31]
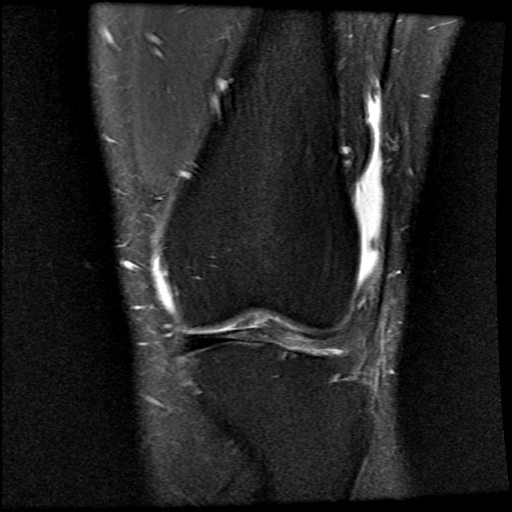
[im 16/31]
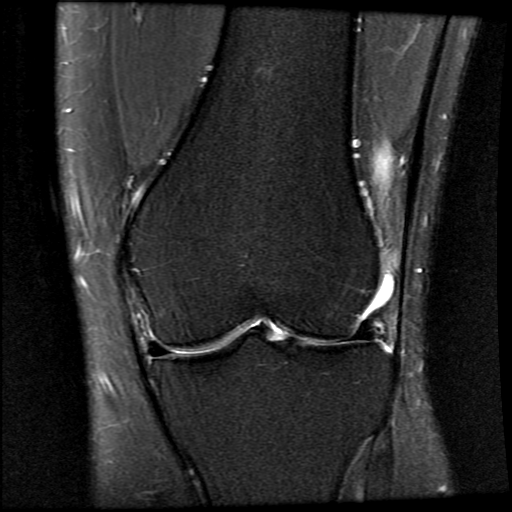
[im 26/31]
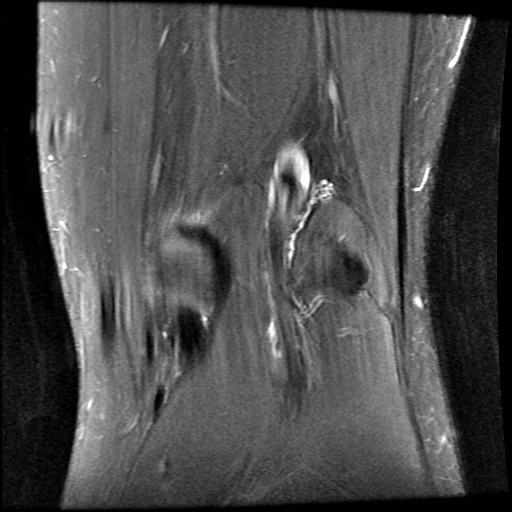

[Series 7: PD fat-sat · sagittal · 3.0mm · 0.29mm/px · 3 of 28 slices shown (3 of 4)]
[im 5/28]
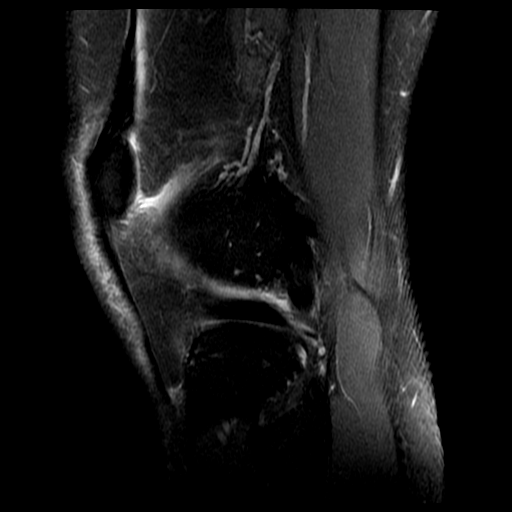
[im 14/28]
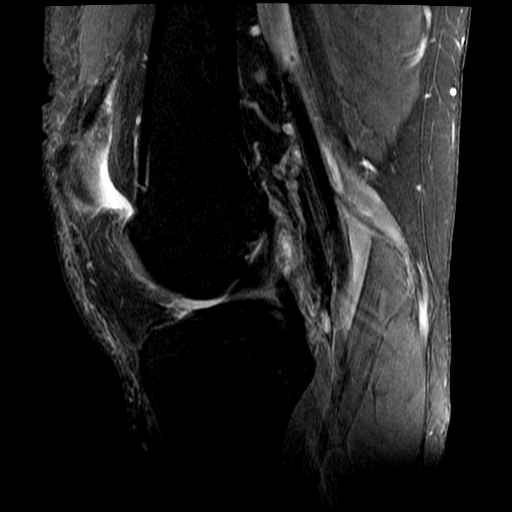
[im 23/28]
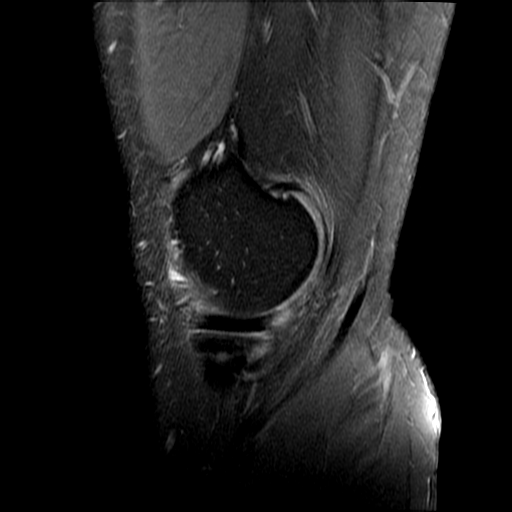

[Series 8: PD fat-sat · coronal · 2.0mm · 0.35mm/px · 3 of 15 slices shown (4 of 4)]
[im 1/15]
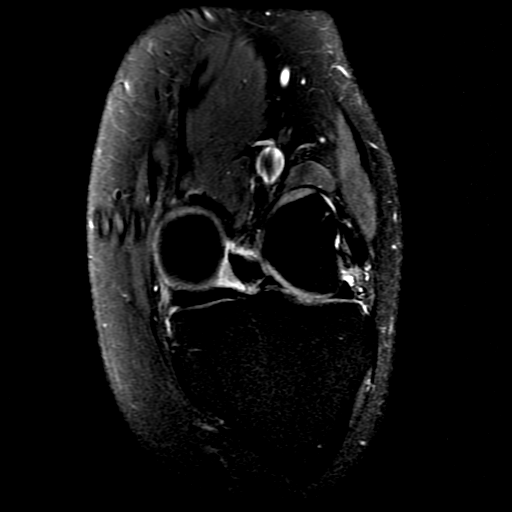
[im 10/15]
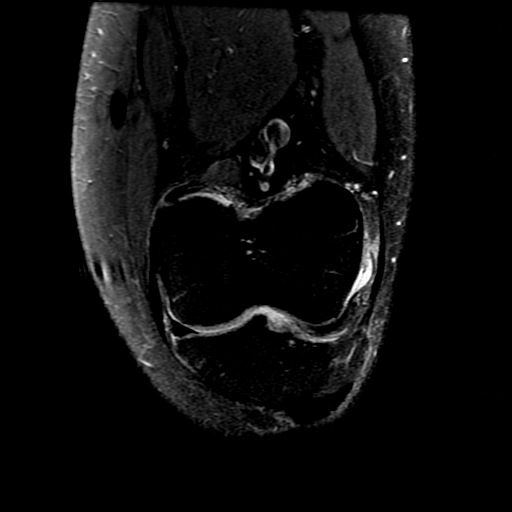
[im 15/15]
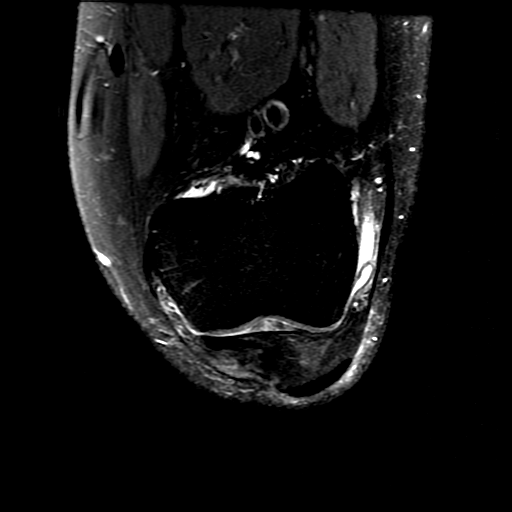

[19 of 40 positions shown; findings below may reference images not displayed]

FINDINGS: MENISCI

Medial meniscus:  Intact.

Lateral meniscus:  Intact.

LIGAMENTS

Cruciates:  Intact.

Collaterals:  Intact.

CARTILAGE

Patellofemoral: There is some thinning and irregularity of hyaline
cartilage in the inferior pole the patella along the lateral facet.
No associated reactive marrow signal change.

Medial:  Unremarkable.

Lateral:  Unremarkable.

Joint:  Very small joint effusion.

Popliteal Fossa:  No Baker's cyst.

Extensor Mechanism:  Intact.

Bones:  Normal marrow signal throughout.
IMPRESSION: Mild chondromalacia inferior pole of patella along the lateral
facet. The examination is otherwise negative. There is no meniscal
or ligament tear.

## 2016-05-20 NOTE — L&D Delivery Note (Signed)
Delivery Note At  1305 a viable female was delivered via  (Presentation:LOA ;  ).  APGAR:8/8 , ; weight  .  pending Placenta status: intact, .  Cord:  with the following complications: .  none  Anesthesia:  epidural Episiotomy:  none Lacerations:  none  Suture Repair:  Est. Blood Loss (mL):   100 Mom to postpartum.  Baby to Couplet care / Skin to Skin.  Jhalil Silvera A Nehemiah Montee 12/08/2016, 1:29 PM

## 2016-05-21 MED FILL — TARON-PREX PRENATAL DHA CAP: 30-1.2-265 | 30 days supply | Qty: 30 | Fill #2

## 2016-05-23 ENCOUNTER — Other Ambulatory Visit (HOSPITAL_COMMUNITY): Payer: Self-pay | Admitting: Certified Nurse Midwife

## 2016-05-23 DIAGNOSIS — Z3491 Encounter for supervision of normal pregnancy, unspecified, first trimester: Secondary | ICD-10-CM | POA: Diagnosis not present

## 2016-05-23 DIAGNOSIS — O09521 Supervision of elderly multigravida, first trimester: Secondary | ICD-10-CM | POA: Diagnosis not present

## 2016-05-23 DIAGNOSIS — Z3689 Encounter for other specified antenatal screening: Secondary | ICD-10-CM

## 2016-05-23 DIAGNOSIS — Z3A11 11 weeks gestation of pregnancy: Secondary | ICD-10-CM | POA: Diagnosis not present

## 2016-05-23 DIAGNOSIS — O0991 Supervision of high risk pregnancy, unspecified, first trimester: Secondary | ICD-10-CM | POA: Diagnosis not present

## 2016-05-23 DIAGNOSIS — Z3A18 18 weeks gestation of pregnancy: Secondary | ICD-10-CM

## 2016-05-23 DIAGNOSIS — Z8279 Family history of other congenital malformations, deformations and chromosomal abnormalities: Secondary | ICD-10-CM

## 2016-06-24 MED FILL — TARON-PREX PRENATAL DHA CAP: 30-1.2-265 | 30 days supply | Qty: 30 | Fill #3

## 2016-07-05 ENCOUNTER — Encounter (HOSPITAL_COMMUNITY): Payer: Self-pay | Admitting: *Deleted

## 2016-07-09 ENCOUNTER — Ambulatory Visit (HOSPITAL_COMMUNITY): Admission: RE | Admit: 2016-07-09 | Payer: 59 | Source: Ambulatory Visit

## 2016-07-09 ENCOUNTER — Encounter (HOSPITAL_COMMUNITY): Payer: Self-pay

## 2016-07-09 ENCOUNTER — Ambulatory Visit (HOSPITAL_COMMUNITY)
Admission: RE | Admit: 2016-07-09 | Discharge: 2016-07-09 | Disposition: A | Discharge status: Home / Self Care | Payer: 59 | Source: Ambulatory Visit | Attending: Obstetrics and Gynecology | Admitting: Obstetrics and Gynecology

## 2016-07-09 ENCOUNTER — Other Ambulatory Visit (HOSPITAL_COMMUNITY): Payer: Self-pay | Admitting: *Deleted

## 2016-07-09 DIAGNOSIS — O09521 Supervision of elderly multigravida, first trimester: Secondary | ICD-10-CM

## 2016-07-09 DIAGNOSIS — O09522 Supervision of elderly multigravida, second trimester: Secondary | ICD-10-CM | POA: Insufficient documentation

## 2016-07-09 DIAGNOSIS — Z3A18 18 weeks gestation of pregnancy: Secondary | ICD-10-CM | POA: Insufficient documentation

## 2016-07-09 DIAGNOSIS — Z8279 Family history of other congenital malformations, deformations and chromosomal abnormalities: Secondary | ICD-10-CM | POA: Diagnosis not present

## 2016-07-09 DIAGNOSIS — O09292 Supervision of pregnancy with other poor reproductive or obstetric history, second trimester: Secondary | ICD-10-CM | POA: Insufficient documentation

## 2016-07-09 DIAGNOSIS — Z363 Encounter for antenatal screening for malformations: Secondary | ICD-10-CM | POA: Diagnosis not present

## 2016-07-09 DIAGNOSIS — Z3689 Encounter for other specified antenatal screening: Secondary | ICD-10-CM | POA: Diagnosis not present

## 2016-07-09 DIAGNOSIS — Z3A19 19 weeks gestation of pregnancy: Secondary | ICD-10-CM | POA: Diagnosis not present

## 2016-07-16 MED FILL — AZITHROMYCIN 250 MG TABLET: 250 | 5 days supply | Qty: 6 | Fill #0

## 2016-07-30 MED FILL — TARON-PREX PRENATAL DHA CAP: 30-1.2-265 | 30 days supply | Qty: 30 | Fill #0

## 2016-08-13 DIAGNOSIS — Z3A23 23 weeks gestation of pregnancy: Secondary | ICD-10-CM | POA: Diagnosis not present

## 2016-08-13 DIAGNOSIS — O09299 Supervision of pregnancy with other poor reproductive or obstetric history, unspecified trimester: Secondary | ICD-10-CM | POA: Diagnosis not present

## 2016-08-26 MED FILL — TARON-PREX PRENATAL DHA CAP: 30-1.2-265 | 90 days supply | Qty: 90 | Fill #1

## 2016-09-11 DIAGNOSIS — Z3A27 27 weeks gestation of pregnancy: Secondary | ICD-10-CM | POA: Diagnosis not present

## 2016-09-11 DIAGNOSIS — O09292 Supervision of pregnancy with other poor reproductive or obstetric history, second trimester: Secondary | ICD-10-CM | POA: Diagnosis not present

## 2016-09-11 DIAGNOSIS — Z3A15 15 weeks gestation of pregnancy: Secondary | ICD-10-CM | POA: Diagnosis not present

## 2016-09-11 DIAGNOSIS — Z23 Encounter for immunization: Secondary | ICD-10-CM | POA: Diagnosis not present

## 2016-09-17 ENCOUNTER — Encounter (HOSPITAL_COMMUNITY): Payer: Self-pay

## 2016-09-17 ENCOUNTER — Ambulatory Visit (HOSPITAL_COMMUNITY): Payer: 59

## 2016-09-21 DIAGNOSIS — O99810 Abnormal glucose complicating pregnancy: Secondary | ICD-10-CM | POA: Diagnosis not present

## 2016-10-09 DIAGNOSIS — Z3A31 31 weeks gestation of pregnancy: Secondary | ICD-10-CM | POA: Diagnosis not present

## 2016-10-09 DIAGNOSIS — Z8759 Personal history of other complications of pregnancy, childbirth and the puerperium: Secondary | ICD-10-CM | POA: Diagnosis not present

## 2016-11-14 DIAGNOSIS — Z3493 Encounter for supervision of normal pregnancy, unspecified, third trimester: Secondary | ICD-10-CM | POA: Diagnosis not present

## 2016-11-14 DIAGNOSIS — O09513 Supervision of elderly primigravida, third trimester: Secondary | ICD-10-CM | POA: Diagnosis not present

## 2016-11-14 DIAGNOSIS — Z3A36 36 weeks gestation of pregnancy: Secondary | ICD-10-CM | POA: Diagnosis not present

## 2016-11-14 DIAGNOSIS — Z3686 Encounter for antenatal screening for cervical length: Secondary | ICD-10-CM | POA: Diagnosis not present

## 2016-11-14 LAB — OB RESULTS CONSOLE GBS: GBS: NEGATIVE

## 2016-11-19 DIAGNOSIS — O0992 Supervision of high risk pregnancy, unspecified, second trimester: Secondary | ICD-10-CM | POA: Diagnosis not present

## 2016-11-19 DIAGNOSIS — Z3A37 37 weeks gestation of pregnancy: Secondary | ICD-10-CM | POA: Diagnosis not present

## 2016-11-25 DIAGNOSIS — Z3A38 38 weeks gestation of pregnancy: Secondary | ICD-10-CM | POA: Diagnosis not present

## 2016-11-25 DIAGNOSIS — O09299 Supervision of pregnancy with other poor reproductive or obstetric history, unspecified trimester: Secondary | ICD-10-CM | POA: Diagnosis not present

## 2016-12-04 DIAGNOSIS — Z3A39 39 weeks gestation of pregnancy: Secondary | ICD-10-CM | POA: Diagnosis not present

## 2016-12-04 DIAGNOSIS — O0992 Supervision of high risk pregnancy, unspecified, second trimester: Secondary | ICD-10-CM | POA: Diagnosis not present

## 2016-12-05 ENCOUNTER — Encounter (HOSPITAL_COMMUNITY): Payer: Self-pay | Admitting: *Deleted

## 2016-12-05 ENCOUNTER — Telehealth (HOSPITAL_COMMUNITY): Payer: Self-pay | Admitting: *Deleted

## 2016-12-05 NOTE — Telephone Encounter (Signed)
Preadmission screen  

## 2016-12-06 ENCOUNTER — Other Ambulatory Visit: Payer: Self-pay | Admitting: Obstetrics & Gynecology

## 2016-12-06 MED FILL — TARON-PREX PRENATAL DHA CAP: 30-1.2-265 | 60 days supply | Qty: 60 | Fill #2

## 2016-12-08 ENCOUNTER — Encounter (HOSPITAL_COMMUNITY): Payer: Self-pay | Admitting: *Deleted

## 2016-12-08 ENCOUNTER — Inpatient Hospital Stay (HOSPITAL_COMMUNITY): Payer: 59 | Admitting: Anesthesiology

## 2016-12-08 ENCOUNTER — Inpatient Hospital Stay (HOSPITAL_COMMUNITY)
Admission: AD | Admit: 2016-12-08 | Discharge: 2016-12-09 | DRG: 775 | Disposition: A | Discharge status: Home / Self Care | Payer: 59 | Source: Ambulatory Visit | Attending: Obstetrics and Gynecology | Admitting: Obstetrics and Gynecology

## 2016-12-08 DIAGNOSIS — Z3A40 40 weeks gestation of pregnancy: Secondary | ICD-10-CM

## 2016-12-08 DIAGNOSIS — O09293 Supervision of pregnancy with other poor reproductive or obstetric history, third trimester: Secondary | ICD-10-CM | POA: Diagnosis not present

## 2016-12-08 DIAGNOSIS — Z3493 Encounter for supervision of normal pregnancy, unspecified, third trimester: Secondary | ICD-10-CM | POA: Diagnosis present

## 2016-12-08 LAB — CBC
HCT: 38.8 % (ref 36.0–46.0)
HEMOGLOBIN: 13.2 g/dL (ref 12.0–15.0)
MCH: 30.6 pg (ref 26.0–34.0)
MCHC: 34 g/dL (ref 30.0–36.0)
MCV: 89.8 fL (ref 78.0–100.0)
PLATELETS: 149 10*3/uL — AB (ref 150–400)
RBC: 4.32 MIL/uL (ref 3.87–5.11)
RDW: 14 % (ref 11.5–15.5)
WBC: 9.5 10*3/uL (ref 4.0–10.5)

## 2016-12-08 LAB — TYPE AND SCREEN
ABO/RH(D): O POS
ANTIBODY SCREEN: NEGATIVE

## 2016-12-08 MED ORDER — PRENATAL MULTIVITAMIN CH
1.0000 | ORAL_TABLET | Freq: Every day | ORAL | Status: DC
  Administered 2016-12-09: 1 via ORAL
  Filled 2016-12-08: qty 1

## 2016-12-08 MED ORDER — LACTATED RINGERS IV SOLN
500.0000 mL | Freq: Once | INTRAVENOUS | Status: AC
  Administered 2016-12-08: 500 mL via INTRAVENOUS

## 2016-12-08 MED ORDER — EPHEDRINE 5 MG/ML INJ: 10.0000 mg | INTRAVENOUS | Status: DC | PRN

## 2016-12-08 MED ORDER — LIDOCAINE HCL (PF) 1 % IJ SOLN
30.0000 mL | INTRAMUSCULAR | Status: DC | PRN
  Filled 2016-12-08: qty 30

## 2016-12-08 MED ORDER — TETANUS-DIPHTH-ACELL PERTUSSIS 5-2.5-18.5 LF-MCG/0.5 IM SUSP: 0.5000 mL | Freq: Once | INTRAMUSCULAR | Status: DC

## 2016-12-08 MED ORDER — OXYTOCIN 40 UNITS IN LACTATED RINGERS INFUSION - SIMPLE MED
2.5000 [IU]/h | INTRAVENOUS | Status: DC
  Administered 2016-12-08: 2.5 [IU]/h via INTRAVENOUS
  Filled 2016-12-08: qty 1000

## 2016-12-08 MED ORDER — OXYCODONE-ACETAMINOPHEN 5-325 MG PO TABS: 2.0000 | ORAL_TABLET | ORAL | Status: DC | PRN

## 2016-12-08 MED ORDER — PHENYLEPHRINE 40 MCG/ML (10ML) SYRINGE FOR IV PUSH (FOR BLOOD PRESSURE SUPPORT): 80.0000 ug | PREFILLED_SYRINGE | INTRAVENOUS | Status: DC | PRN

## 2016-12-08 MED ORDER — FENTANYL 2.5 MCG/ML BUPIVACAINE 1/10 % EPIDURAL INFUSION (WH - ANES): 14.0000 mL/h | INTRAMUSCULAR | Status: DC | PRN

## 2016-12-08 MED ORDER — ACETAMINOPHEN 325 MG PO TABS: 650.0000 mg | ORAL_TABLET | ORAL | Status: DC | PRN

## 2016-12-08 MED ORDER — OXYCODONE-ACETAMINOPHEN 5-325 MG PO TABS: 1.0000 | ORAL_TABLET | ORAL | Status: DC | PRN

## 2016-12-08 MED ORDER — LIDOCAINE HCL (PF) 1 % IJ SOLN
INTRAMUSCULAR | Status: DC | PRN
  Administered 2016-12-08 (×2): 5 mL via EPIDURAL

## 2016-12-08 MED ORDER — DIBUCAINE 1 % RE OINT: 1.0000 "application " | TOPICAL_OINTMENT | RECTAL | Status: DC | PRN

## 2016-12-08 MED ORDER — ONDANSETRON HCL 4 MG PO TABS: 4.0000 mg | ORAL_TABLET | ORAL | Status: DC | PRN

## 2016-12-08 MED ORDER — WITCH HAZEL-GLYCERIN EX PADS: 1.0000 "application " | MEDICATED_PAD | CUTANEOUS | Status: DC | PRN

## 2016-12-08 MED ORDER — LACTATED RINGERS IV SOLN: 500.0000 mL | INTRAVENOUS | Status: DC | PRN

## 2016-12-08 MED ORDER — FLEET ENEMA 7-19 GM/118ML RE ENEM: 1.0000 | ENEMA | RECTAL | Status: DC | PRN

## 2016-12-08 MED ORDER — ONDANSETRON HCL 4 MG/2ML IJ SOLN: 4.0000 mg | INTRAMUSCULAR | Status: DC | PRN

## 2016-12-08 MED ORDER — EPHEDRINE 5 MG/ML INJ
10.0000 mg | INTRAVENOUS | Status: DC | PRN
  Filled 2016-12-08: qty 2

## 2016-12-08 MED ORDER — LACTATED RINGERS IV SOLN
INTRAVENOUS | Status: DC
  Administered 2016-12-08: 125 mL/h via INTRAVENOUS
  Administered 2016-12-08: 10:00:00 via INTRAVENOUS

## 2016-12-08 MED ORDER — SENNOSIDES-DOCUSATE SODIUM 8.6-50 MG PO TABS
2.0000 | ORAL_TABLET | ORAL | Status: DC
  Administered 2016-12-09: 2 via ORAL
  Filled 2016-12-08: qty 2

## 2016-12-08 MED ORDER — FENTANYL 2.5 MCG/ML BUPIVACAINE 1/10 % EPIDURAL INFUSION (WH - ANES)
14.0000 mL/h | INTRAMUSCULAR | Status: DC | PRN
  Administered 2016-12-08 (×2): 14 mL/h via EPIDURAL
  Filled 2016-12-08: qty 100

## 2016-12-08 MED ORDER — COCONUT OIL OIL: 1.0000 "application " | TOPICAL_OIL | Status: DC | PRN

## 2016-12-08 MED ORDER — IBUPROFEN 600 MG PO TABS
600.0000 mg | ORAL_TABLET | Freq: Four times a day (QID) | ORAL | Status: DC
  Administered 2016-12-08 – 2016-12-09 (×4): 600 mg via ORAL
  Filled 2016-12-08 (×4): qty 1

## 2016-12-08 MED ORDER — OXYTOCIN BOLUS FROM INFUSION
500.0000 mL | Freq: Once | INTRAVENOUS | Status: AC
  Administered 2016-12-08: 500 mL via INTRAVENOUS

## 2016-12-08 MED ORDER — SOD CITRATE-CITRIC ACID 500-334 MG/5ML PO SOLN: 30.0000 mL | ORAL | Status: DC | PRN

## 2016-12-08 MED ORDER — PHENYLEPHRINE 40 MCG/ML (10ML) SYRINGE FOR IV PUSH (FOR BLOOD PRESSURE SUPPORT)
80.0000 ug | PREFILLED_SYRINGE | INTRAVENOUS | Status: DC | PRN
  Filled 2016-12-08: qty 5

## 2016-12-08 MED ORDER — BENZOCAINE-MENTHOL 20-0.5 % EX AERO
1.0000 | INHALATION_SPRAY | CUTANEOUS | Status: DC | PRN
Start: 2016-12-08 — End: 2016-12-09
  Filled 2016-12-08: qty 56

## 2016-12-08 MED ORDER — DIPHENHYDRAMINE HCL 50 MG/ML IJ SOLN: 12.5000 mg | INTRAMUSCULAR | Status: DC | PRN

## 2016-12-08 MED ORDER — ZOLPIDEM TARTRATE 5 MG PO TABS: 5.0000 mg | ORAL_TABLET | Freq: Every evening | ORAL | Status: DC | PRN

## 2016-12-08 MED ORDER — DIPHENHYDRAMINE HCL 25 MG PO CAPS: 25.0000 mg | ORAL_CAPSULE | Freq: Four times a day (QID) | ORAL | Status: DC | PRN

## 2016-12-08 MED ORDER — PHENYLEPHRINE 40 MCG/ML (10ML) SYRINGE FOR IV PUSH (FOR BLOOD PRESSURE SUPPORT)
80.0000 ug | PREFILLED_SYRINGE | INTRAVENOUS | Status: DC | PRN
  Filled 2016-12-08: qty 5
  Filled 2016-12-08: qty 10

## 2016-12-08 MED ORDER — SIMETHICONE 80 MG PO CHEW
80.0000 mg | CHEWABLE_TABLET | ORAL | Status: DC | PRN
Start: 2016-12-08 — End: 2016-12-09

## 2016-12-08 MED ORDER — ONDANSETRON HCL 4 MG/2ML IJ SOLN: 4.0000 mg | Freq: Four times a day (QID) | INTRAMUSCULAR | Status: DC | PRN

## 2016-12-08 MED ORDER — LACTATED RINGERS IV SOLN: 500.0000 mL | Freq: Once | INTRAVENOUS | Status: DC

## 2016-12-08 NOTE — Anesthesia Postprocedure Evaluation (Signed)
Anesthesia Post Note  [name]  Procedure(s) Performed: * No procedures listed *     Anesthesia Post Evaluation  Last Vitals:  Vitals:   12/08/16 1333 12/08/16 1346  BP: 131/67 136/67  Pulse: 76 (!) 59  Resp: 15 17  Temp:      Last Pain:  Vitals:   12/08/16 1155  TempSrc: Oral  PainSc:    Pain Goal: Patients Stated Pain Goal: 3 (12/08/16 0825)               Hyun Reali

## 2016-12-08 NOTE — MAU Note (Signed)
Patient presents with contractions 3 to 5 minutes apart onset 4:00 today, no vaginal bleeding.

## 2016-12-08 NOTE — MAU Note (Signed)
Contractions started 0400 consistant, no vaginal bleeding or loss of fluid,

## 2016-12-08 NOTE — Anesthesia Procedure Notes (Signed)
Epidural Patient location during procedure: OB Start time: 12/08/2016 11:22 AM End time: 12/08/2016 11:26 AM  Staffing Anesthesiologist: Kainan Patty  Preanesthetic Checklist Completed: patient identified, site marked, surgical consent, pre-op evaluation, timeout performed, IV checked, risks and benefits discussed and monitors and equipment checked  Epidural Patient position: sitting Prep: site prepped and draped and DuraPrep Patient monitoring: continuous pulse ox and blood pressure Approach: midline Location: L4-L5 Injection technique: LOR air  Needle:  Needle type: Tuohy  Needle gauge: 17 G Needle length: 9 cm and 9 Needle insertion depth: 5 cm cm Catheter type: closed end flexible Catheter size: 19 Gauge Catheter at skin depth: 10 cm Test dose: negative  Assessment Events: blood not aspirated, injection not painful, no injection resistance, negative IV test and no paresthesia

## 2016-12-08 NOTE — Anesthesia Postprocedure Evaluation (Signed)
Anesthesia Post Note  Patient: Ann LombardJennifer D [place] Roberts  Procedure(s) Performed: * No procedures listed *     Patient location during evaluation: Mother Baby Anesthesia Type: Epidural Level of consciousness: awake and alert Pain management: pain level controlled Vital Signs Assessment: post-procedure vital signs reviewed and stable Respiratory status: spontaneous breathing, nonlabored ventilation and respiratory function stable Cardiovascular status: stable Postop Assessment: no headache, no backache and epidural receding Anesthetic complications: no    Last Vitals:  Vitals:   12/08/16 1333 12/08/16 1346  BP: 131/67 136/67  Pulse: 76 (!) 59  Resp: 15 17  Temp:      Last Pain:  Vitals:   12/08/16 1155  TempSrc: Oral  PainSc:    Pain Goal: Patients Stated Pain Goal: 3 (12/08/16 0825)               Jeselle Hiser

## 2016-12-08 NOTE — H&P (Signed)
LABOR ADMISSION HISTORY AND PHYSICAL  Ann BassetJennifer D Zweig is a 36 y.o. female [phone] with IUP at 768w0d presenting for labor. She initially reported to [facility] at 4cm and sent to L/D ; epidural was placed and she was 6-7 AROM clear; an hour later she is complete. She reports +FMs, No LOF, no VB, no blurry vision, headaches or peripheral edema, and RUQ pain.  She plans on breastfeeding. She request vasectomyfor birth control.  EFW 9 lbs  Prenatal History/Complications:  Past Medical History: Past Medical History:  Diagnosis Date  . AMA (advanced maternal age) multigravida 35+   . H/O toxoplasmosis    pt. denies  . H/O varicella   . History of cystitis   . Hx: UTI (urinary tract infection)    x 1  . Hx: UTI (urinary tract infection)   . Infertility, female   . No pertinent past medical history   . Status post epidural steroid injection    ruptured disc L5 and S1    Past Surgical History: Past Surgical History:  Procedure Laterality Date  . ANTERIOR CRUCIATE LIGAMENT REPAIR  1996   R  . epidural steroids  2007   x2 for ruptured disc in back  . TONSILLECTOMY  2002  . WISDOM TOOTH EXTRACTION      Obstetrical History: OB History    Gravida Para Term Preterm AB Living   4 3 3  0 0 2   SAB TAB Ectopic Multiple Live Births   0 0 0 0 3      Social History: Social History   Social History  . Marital status: Married    Spouse name: N/A  . Number of children: N/A  . Years of education: N/A   Social History Main Topics  . Smoking status: Never Smoker  . Smokeless tobacco: Never Used  . Alcohol use No  . Drug use: No  . Sexual activity: Yes     Comment: currently pregnant   Other Topics Concern  . None   Social History Narrative  . None    Family History: Family History  Problem Relation Age of Onset  . Migraines Father   . Heart disease Father   . Hypertension Father   . Hypertension Paternal Aunt   . Factor V Leiden deficiency Paternal Aunt   . Heart attack  Maternal Grandmother   . Diabetes Maternal Grandmother   . Cancer Maternal Grandfather        lung  . Dementia Paternal Grandmother   . Mental illness Paternal Grandmother        Dementia   . Hypertension Mother     Allergies: No Known Allergies  Prescriptions Prior to Admission  Medication Sig Dispense Refill Last Dose  . calcium carbonate (TUMS - DOSED IN MG ELEMENTAL CALCIUM) 500 MG chewable tablet Chew 1 tablet by mouth daily as needed for indigestion or heartburn.    Past Month at Unknown time  . Prenat-FeFum-DSS-FA-DHA w/o A (TARON-PREX) 30-1.2-265 MG CAPS Take 1 capsule by mouth daily.  1 12/08/2016 at Unknown time  . azithromycin (ZITHROMAX Z-PAK) 250 MG tablet As directed (Patient not taking: Reported on 07/09/2016) 1 each 0 Not Taking  . ibuprofen (ADVIL,MOTRIN) 600 MG tablet Take 1 tablet (600 mg total) by mouth every 6 (six) hours as needed for pain. (Patient not taking: Reported on 08/09/2014) 30 tablet 1 Not Taking  . ofloxacin (FLOXIN) 0.3 % otic solution Place 5 drops into both ears daily. (Patient not taking: Reported on 07/09/2016) 5  mL 0 Not Taking  . Prenatal Vit-Fe Fumarate-FA (PRENATAL MULTIVITAMIN) TABS Take 1 tablet by mouth daily. (Patient not taking: Reported on 12/08/2016) 90 tablet 11 Not Taking at Unknown time     Review of Systems   All systems reviewed and negative except as stated in HPI  Blood pressure 127/66, pulse 63, temperature 98.2 F (36.8 C), temperature source Oral, resp. rate 18, height 5\' 7"  (1.702 m), weight 200 lb (90.7 kg), last menstrual period 01/30/2016, SpO2 98 %, unknown if currently breastfeeding. General appearance: alert and cooperative Lungs: clear to auscultation bilaterally Heart: regular rate and rhythm Abdomen: soft, non-tender; bowel sounds normal Pelvic: wnl Extremities: Homans sign is negative, no sign of DVT DTR's wnl Presentation: cephalic Fetal monitoringcategory 1 Uterine activityDate/time of onset: 400am Dilation:  10 Effacement (%): 100 Station: +1 Exam by:: s grindstaff rn   Prenatal labs: ABO, Rh: --/--/O POS (07/22 0920) Antibody: NEG (07/22 0920) Rubella: !Error! RPR: Nonreactive (12/07 0000)  HBsAg: Negative (12/07 0000)  HIV: Non-reactive (12/07 0000)  GBS: Negative (06/28 0000)  1 hr Glucola  Genetic screening   Anatomy US wnl  Prenatal Transfer Tool  Maternal Diabetes: No Genetic Screening: Declined Maternal Ultrasounds/Referrals: Declined Fetal Ultrasounds or other Referrals:  None Maternal Substance Abuse:  No Significant Maternal Medications:  None Significant Maternal Lab Results: None  Results for orders placed or performed during the hospital encounter of 12/08/16 (from the past 24 hour(s))  CBC   Collection Time: 12/08/16  9:20 AM  Result Value Ref Range   WBC 9.5 4.0 - 10.5 K/uL   RBC 4.32 3.87 - 5.11 MIL/uL   Hemoglobin 13.2 12.0 - 15.0 g/dL   HCT 16.1 09.6 - 04.5 %   MCV 89.8 78.0 - 100.0 fL   MCH 30.6 26.0 - 34.0 pg   MCHC 34.0 30.0 - 36.0 g/dL   RDW 40.9 81.1 - 91.4 %   Platelets 149 (L) 150 - 400 K/uL  Type and screen [facility]   Collection Time: 12/08/16  9:20 AM  Result Value Ref Range   ABO/RH(D) O POS    Antibody Screen NEG    Sample Expiration 12/11/2016     Patient Active Problem List   Diagnosis Date Noted  . Normal labor 12/08/2016  . Vaginal delivery 09/29/2014  . Post-dates pregnancy October 27, 2014  . Neonatal death in prior pregnancy, currently pregnant   . AMA (advanced maternal age) multigravida 35+   . 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 Maternal age 42+, multigravida, antepartum   . Hx of neonatal death in prior pregnancy--occurred 2 days after delivery 03/06/2012    Assessment: Ann Roberts is a 36 y.o. [id number] at [redacted]w[redacted]d here for labor  #Labor:Active AMA #Pain: Epidural  #ID:  GBS negative #MOF: Breastfeeding #MOC: Vasectomy  Keishana Klinger CNM  12/08/2016,  12:51 PM

## 2016-12-08 NOTE — Anesthesia Preprocedure Evaluation (Signed)

## 2016-12-09 ENCOUNTER — Inpatient Hospital Stay (HOSPITAL_COMMUNITY): Admission: RE | Admit: 2016-12-09 | Payer: 59 | Source: Ambulatory Visit

## 2016-12-09 MED ORDER — IBUPROFEN 600 MG PO TABS: 600.0000 mg | ORAL_TABLET | Freq: Four times a day (QID) | ORAL | 2 refills | Status: DC | PRN

## 2016-12-09 NOTE — Lactation Note (Signed)
This note was copied from a baby's chart. Lactation Consultation Note  Patient Name: Ann Roberts Reason for consult: Initial assessment   P4, 1st child deceased, 2nd child latching difficulty, 3rd breastfed until approx 5-6 mos.  Mother hand expressed drops of colostrum and then latched baby in cross cradle position. Baby sucked a few times and fell back asleep and came back off breast but then returned for a few more sucks. Latch depth looked good.  Encouraged mother to compress breast while latched to keep baby active. Mom encouraged to feed baby 8-12 times/24 hours and with feeding cues.  Mom made aware of O/P services, breastfeeding support groups, community resources, and our phone # for post-discharge questions.  Reviewed engorgement care and monitoring voids/stools. Provided mother w/ UMR breast pump. Discussed that she is discharged today and baby has difficulty latching she should start pumping w/ DEBP and call for OP appt.   Maternal Data Has patient been taught Hand Expression?: Yes Does the patient have breastfeeding experience prior to this delivery?: Yes  Feeding Feeding Type: Breast Fed Length of feed:  (baby sucked a few times off and on then came off )  LATCH Score/Interventions Latch: Repeated attempts needed to sustain latch, nipple held in mouth throughout feeding, stimulation needed to elicit sucking reflex.  Audible Swallowing: A few with stimulation  Type of Nipple: Everted at rest and after stimulation  Comfort (Breast/Nipple): Soft / non-tender     Hold (Positioning): No assistance needed to correctly position infant at breast.  LATCH Score: 8  Lactation Tools Discussed/Used     Consult Status Consult Status: Follow-up Date: 12/10/16 Follow-up type: In-patient    Dahlia ByesBerkelhammer, [name], 12:17 PM

## 2016-12-09 NOTE — Discharge Summary (Signed)
[facility] Discharge Summary   Patient Name:   Ann Roberts DOB:     11/23/1980 MRN:     [id number]  Date of Admission:   12/08/2016 Date of Discharge:  12/09/2016  Admitting diagnosis:    40WKS CONTRACTIONS 3-5MINS  Principal Problem:   Vaginal delivery  Term Pregnancy Delivered and AMA    Discharge diagnosis:    40WKS CONTRACTIONS 3-5MINS  Principal Problem:   Vaginal delivery  Term Pregnancy Delivered and AMA                                                                     Post partum procedures: NA  Type of Delivery:  SVD  Delivering Provider: Rhea Pink   Date of Delivery:  12/08/16  Newborn Data:    Live born female  Birth Weight: 9 lb 12.6 oz (4440 g) APGARS: 8, 8  Baby's Name:  Raynelle Chary Feeding:   Breast Disposition:   home with mother  Complications:   None  Hospital course:      Onset of Labor With Vaginal Delivery     36 y.o. yo [id number] at [redacted]w[redacted]d was admitted in Latent Labor on 12/08/2016. Patient had an uncomplicated labor course as follows:  Membrane Rupture Time/Date: 11:40 AM ,12/08/2016   Intrapartum Procedures: Episiotomy: None [1]                                         Lacerations:  None [1]  Patient had a delivery of a Viable infant. 12/08/2016  Information for the patient's newborn:  Reign, Dziuba [[id number]]  Delivery Method: Vaginal, Spontaneous Delivery (Filed from Delivery Summary)    Patient had an uncomplicated postpartum course. She is ambulating, tolerating a regular diet, passing flatus, had small BM, urinating well, and pain is well controlled w/ Tyl/Ibuprofen combo. She is discharged home in stable condition on 12/09/16. Physical Exam:   Vitals:   12/08/16 1530 12/08/16 1630 12/08/16 2000 12/08/16 2030  BP: 132/68 133/64 (!) 142/56 122/64  Pulse: 62 79 68 64  Resp: 16 16 18    Temp: 98.5 F (36.9 C) 98.1 F (36.7 C) 98.4 F (36.9 C)   TempSrc: Oral Oral Oral   SpO2:      Weight:       Height:       General: alert, cooperative and no distress Lochia: appropriate Uterine Fundus: firm Incision: N/A DVT Evaluation: No evidence of DVT seen on physical exam. Negative Homan's sign. No cords or calf tenderness. No significant calf/ankle edema.  Labs: Lab Results  Component Value Date   WBC 9.5 12/08/2016   HGB 13.2 12/08/2016   HCT 38.8 12/08/2016   MCV 89.8 12/08/2016   PLT 149 (L) 12/08/2016   Discharge instruction: per After Visit Summary and "Baby and Me Booklet".  After Visit Meds:  Allergies as of 12/09/2016   No Known Allergies     Medication List    STOP taking these medications   azithromycin 250 MG tablet Commonly known as:  ZITHROMAX Z-PAK   calcium carbonate 500 MG chewable tablet Commonly known as:  TUMS - dosed in mg  elemental calcium   ofloxacin 0.3 % OTIC solution Commonly known as:  FLOXIN   prenatal multivitamin Tabs tablet     TAKE these medications   ibuprofen 600 MG tablet Commonly known as:  ADVIL,MOTRIN Take 1 tablet (600 mg total) by mouth every 6 (six) hours as needed for fever, headache, mild pain, moderate pain or cramping. What changed:  reasons to take this   TARON-PREX 30-1.2-265 MG Caps Take 1 capsule by mouth daily.      Diet: routine diet  Activity: Advance as tolerated. Pelvic rest for 6 weeks.   Outpatient follow up:6 weeks  Postpartum contraception: Vasectomy  12/09/2016 Sherre ScarletWILLIAMS, Celso Granja, CNM

## 2016-12-09 NOTE — Progress Notes (Signed)
CSW received consult for "neonatal death in 2013."  CSW does not feel it is appropriate to discuss this loss at this time unless MOB is displaying symptoms of depression, anxiety, lack of bonding with new baby, etc.  CSW is screening out referral and asks to be called if current concerns arise, by MOB's request, or if MOB scores 9 or greater/yes to question 10 on the [place] screen.

## 2016-12-09 NOTE — Discharge Instructions (Signed)
Postpartum Care After Vaginal Delivery °The period of time right after you deliver your newborn is called the postpartum period. °What kind of medical care will I receive? °· You may continue to receive fluids and medicines through an IV tube inserted into one of your veins. °· If an incision was made near your vagina (episiotomy) or if you had some vaginal tearing during delivery, cold compresses may be placed on your episiotomy or your tear. This helps to reduce pain and swelling. °· You may be given a squirt bottle to use when you go to the bathroom. You may use this until you are comfortable wiping as usual. To use the squirt bottle, follow these steps: °? Before you urinate, fill the squirt bottle with warm water. Do not use hot water. °? After you urinate, while you are sitting on the toilet, use the squirt bottle to rinse the area around your urethra and vaginal opening. This rinses away any urine and blood. °? You may do this instead of wiping. As you start healing, you may use the squirt bottle before wiping yourself. Make sure to wipe gently. °? Fill the squirt bottle with clean water every time you use the bathroom. °· You will be given sanitary pads to wear. °How can I expect to feel? °· You may not feel the need to urinate for several hours after delivery. °· You will have some soreness and pain in your abdomen and vagina. °· If you are breastfeeding, you may have uterine contractions every time you breastfeed for up to several weeks postpartum. Uterine contractions help your uterus return to its normal size. °· It is normal to have vaginal bleeding (lochia) after delivery. The amount and appearance of lochia is often similar to a menstrual period in the first week after delivery. It will gradually decrease over the next few weeks to a dry, yellow-brown discharge. For most women, lochia stops completely by 6-8 weeks after delivery. Vaginal bleeding can vary from woman to woman. °· Within the first few  days after delivery, you may have breast engorgement. This is when your breasts feel heavy, full, and uncomfortable. Your breasts may also throb and feel hard, tightly stretched, warm, and tender. After this occurs, you may have milk leaking from your breasts. Your health care provider can help you relieve discomfort due to breast engorgement. Breast engorgement should go away within a few days. °· You may feel more sad or worried than normal due to hormonal changes after delivery. These feelings should not last more than a few days. If these feelings do not go away after several days, speak with your health care provider. °How should I care for myself? °· Tell your health care provider if you have pain or discomfort. °· Drink enough water to keep your urine clear or pale yellow. °· Wash your hands thoroughly with soap and water for at least 20 seconds after changing your sanitary pads, after using the toilet, and before holding or feeding your baby. °· If you are not breastfeeding, avoid touching your breasts a lot. Doing this can make your breasts produce more milk. °· If you become weak or lightheaded, or you feel like you might faint, ask for help before: °? Getting out of bed. °? Showering. °· Change your sanitary pads frequently. Watch for any changes in your flow, such as a sudden increase in volume, a change in color, the passing of large blood clots. If you pass a blood clot from your vagina, save it   to show to your health care provider. Do not flush blood clots down the toilet without having your health care provider look at them.  Make sure that all your vaccinations are up to date. This can help protect you and your baby from getting certain diseases. You may need to have immunizations done before you leave the hospital.  If desired, talk with your health care provider about methods of family planning or birth control (contraception). How can I start bonding with my baby? Spending as much time as  possible with your baby is very important. During this time, you and your baby can get to know each other and develop a bond. Having your baby stay with you in your room (rooming in) can give you time to get to know your baby. Rooming in can also help you become comfortable caring for your baby. Breastfeeding can also help you bond with your baby. How can I plan for returning home with my baby?  Make sure that you have a car seat installed in your vehicle. ? Your car seat should be checked by a certified car seat installer to make sure that it is installed safely. ? Make sure that your baby fits into the car seat safely.  Ask your health care provider any questions you have about caring for yourself or your baby. Make sure that you are able to contact your health care provider with any questions after leaving the hospital. This information is not intended to replace advice given to you by your health care provider. Make sure you discuss any questions you have with your health care provider. Document Released: 03/03/2007 Document Revised: 10/09/2015 Document Reviewed: 04/10/2015 Elsevier Interactive Patient Education  2018 [employer] Postpartum Depression and Baby Blues The postpartum period begins right after the birth of a baby. During this time, there is often a great amount of joy and excitement. It is also a time of many changes in the life of the parents. Regardless of how many times a mother gives birth, each child brings new challenges and dynamics to the family. It is not unusual to have feelings of excitement along with confusing shifts in moods, emotions, and thoughts. All mothers are at risk of developing postpartum depression or the "baby blues." These mood changes can occur right after giving birth, or they may occur many months after giving birth. The baby blues or postpartum depression can be mild or severe. Additionally, postpartum depression can go away rather quickly, or it can be a  long-term condition. What are the causes? Raised hormone levels and the rapid drop in those levels are thought to be a main cause of postpartum depression and the baby blues. A number of hormones change during and after pregnancy. Estrogen and progesterone usually decrease right after the delivery of your baby. The levels of thyroid hormone and various cortisol steroids also rapidly drop. Other factors that play a role in these mood changes include major life events and genetics. What increases the risk? If you have any of the following risks for the baby blues or postpartum depression, know what symptoms to watch out for during the postpartum period. Risk factors that may increase the likelihood of getting the baby blues or postpartum depression include:  Having a personal or family history of depression.  Having depression while being pregnant.  Having premenstrual mood issues or mood issues related to oral contraceptives.  Having a lot of life stress.  Having marital conflict.  Lacking a social support network.  Having a baby with special needs.  Having health problems, such as diabetes.  What are the signs or symptoms? Symptoms of baby blues include:  Brief changes in mood, such as going from extreme happiness to sadness.  Decreased concentration.  Difficulty sleeping.  Crying spells, tearfulness.  Irritability.  Anxiety.  Symptoms of postpartum depression typically begin within the first month after giving birth. These symptoms include:  Difficulty sleeping or excessive sleepiness.  Marked weight loss.  Agitation.  Feelings of worthlessness.  Lack of interest in activity or food.  Postpartum psychosis is a very serious condition and can be dangerous. Fortunately, it is rare. Displaying any of the following symptoms is cause for immediate medical attention. Symptoms of postpartum psychosis include:  Hallucinations and delusions.  Bizarre or disorganized  behavior.  Confusion or disorientation.  How is this diagnosed? A diagnosis is made by an evaluation of your symptoms. There are no medical or lab tests that lead to a diagnosis, but there are various questionnaires that a health care provider may use to identify those with the baby blues, postpartum depression, or psychosis. Often, a screening tool called the [place] Postnatal Depression Scale is used to diagnose depression in the postpartum period. How is this treated? The baby blues usually goes away on its own in 1-2 weeks. Social support is often all that is needed. You will be encouraged to get adequate sleep and rest. Occasionally, you may be given medicines to help you sleep. Postpartum depression requires treatment because it can last several months or longer if it is not treated. Treatment may include individual or group therapy, medicine, or both to address any social, physiological, and psychological factors that may play a role in the depression. Regular exercise, a healthy diet, rest, and social support may also be strongly recommended. Postpartum psychosis is more serious and needs treatment right away. Hospitalization is often needed. Follow these instructions at home:  Get as much rest as you can. Nap when the baby sleeps.  Exercise regularly. Some women find yoga and walking to be beneficial.  Eat a balanced and nourishing diet.  Do little things that you enjoy. Have a cup of tea, take a bubble bath, read your favorite magazine, or listen to your favorite music.  Avoid alcohol.  Ask for help with household chores, cooking, grocery shopping, or running errands as needed. Do not try to do everything.  Talk to people close to you about how you are feeling. Get support from your partner, family members, friends, or other new moms.  Try to stay positive in how you think. Think about the things you are grateful for.  Do not spend a lot of time alone.  Only take  over-the-counter or prescription medicine as directed by your health care provider.  Keep all your postpartum appointments.  Let your health care provider know if you have any concerns. Contact a health care provider if: You are having a reaction to or problems with your medicine. Get help right away if:  You have suicidal feelings.  You think you may harm the baby or someone else. This information is not intended to replace advice given to you by your health care provider. Make sure you discuss any questions you have with your health care provider. Document Released: 02/08/2004 Document Revised: 10/12/2015 Document Reviewed: 02/15/2013 Elsevier Interactive Patient Education  2017 [employer] Breast Engorgement Breast engorgement is the overfilling of your breasts with breast milk. In the first few weeks after giving birth, you  may experience breast engorgement. Although it is normal for your breasts to feel heavy, full, and uncomfortable within 3-5 days of giving birth, breast engorgement can make your breasts throb and feel hard, tightly stretched, warm, and tender. Engorgement peaks about the fifth day after you give birth. Breast engorgement can be easily treated and does not require you to stop breastfeeding. What are the causes? Some women delay feedings because of sore or cracked nipples, which can lead to engorgement. Cracked and sore nipples often are caused by inadequate latching (when your baby's mouth attaches to your breast to breastfeed). If your baby is latched on properly, he or she should be able to breastfeed as long as needed, without causing any pain. If you do feel pain while breastfeeding, take your baby off your breast and try again. Get help from your health care provider or a lactation consultant if you continue to have pain. Other causes of engorgement include:  Improper position of your baby while breastfeeding.  Allowing too much time to pass between  feedings.  Reduction in breastfeeding because you give your baby water, juice, formula, breast milk from a bottle, or a pacifier instead of breastfeeding.  Changes in your babys feeding patterns.  Weak sucking from your baby, which causes less milk to be taken out of your breast during feedings.  Fatigue, stress, anemia.  Plugged milk ducts.  A history of breast surgery.  What are the signs or symptoms? If your breasts become engorged, you may experience:  Breast swelling, tenderness, warmth, redness, or throbbing.  Breast hardness and stretching of the skin around your breast.  Flattening, tightening, and hardening of your nipple.  A low-grade fever, which can be confused with a breast infection.  How is this treated? Breast engorgement should improve in 24-48 hours after following these recommendations:  Breastfeed when you feel the need to reduce the fullness of your breasts or when your baby shows signs of hunger. This is called "breastfeeding on demand."  Newborns (babies younger than 4 weeks) often breastfeed every 1-3 hours during the day. You may need to awaken your baby to feed if he or she is asleep at a feeding time.  Do not allow your baby to sleep longer than 5 hours during the night without a feeding.  Pump or hand-express breast milk before breastfeeding to soften your breast, areola, and nipple.  Apply warm, moist heat (in the shower or with warm water-soaked hand towels) just before feeding or pumping, or massage your breast before or during breastfeeding. This increases circulation and helps your milk to flow.  Completely empty your breasts when breastfeeding or pumping. Afterward, wear a snug bra (nursing or regular) or tank top for 1-2 days to signal your body to slightly decrease milk production. Only wear snug bras or tank tops to treat engorgement. Tight bras typically should be avoided by breastfeeding mothers. Once engorgement is relieved, return to  wearing regular, loose-fitting clothes.  Apply ice packs to your breasts to lessen the pain from engorgement and relieve swelling, unless the ice is uncomfortable for you.  Do not delay feedings. Try to relax when it is time to feed your baby. This helps to trigger your "let-down reflex," which releases milk from your breast.  Ensure your baby is latched on to your breast and positioned properly while breastfeeding.  Allow your baby to remain at your breast as long as he or she is latched on well and actively sucking. Your baby will let you  know when he or she is done breastfeeding by pulling away from your breast or falling asleep.  Avoid introducing bottles or pacifiers to your baby in the early weeks of breastfeeding. Wait to introduce these things until after resolving any breastfeeding challenges.  Try to pump your milk on the same schedule as when your baby would breastfeed if you are returning to work or away from home for an extended period.  Drink plenty of fluids to avoid dehydration, which can eventually put you at greater risk of breast engorgement.  Contact a health care provider if:  Engorgement lasts longer than 2 days, even after treatment.  You have flu-like symptoms, such as a fever, chills, or body aches.  Your breasts become increasingly red and painful. This information is not intended to replace advice given to you by your health care provider. Make sure you discuss any questions you have with your health care provider. Document Released: 08/31/2004 Document Revised: 10/18/2015 Document Reviewed: 10/29/2012 Elsevier Interactive Patient Education  2017 [employer]. Breastfeeding Deciding to breastfeed is one of the best choices you can make for you and your baby. A change in hormones during pregnancy causes your breast tissue to grow and increases the number and size of your milk ducts. These hormones also allow proteins, sugars, and fats from your blood supply to  make breast milk in your milk-producing glands. Hormones prevent breast milk from being released before your baby is born as well as prompt milk flow after birth. Once breastfeeding has begun, thoughts of your baby, as well as his or her sucking or crying, can stimulate the release of milk from your milk-producing glands. Benefits of breastfeeding For Your Baby  Your first milk (colostrum) helps your baby's digestive system function better.  There are antibodies in your milk that help your baby fight off infections.  Your baby has a lower incidence of asthma, allergies, and sudden infant death syndrome.  The nutrients in breast milk are better for your baby than infant formulas and are designed uniquely for your babys needs.  Breast milk improves your baby's brain development.  Your baby is less likely to develop other conditions, such as childhood obesity, asthma, or type 2 diabetes mellitus.  For You  Breastfeeding helps to create a very special bond between you and your baby.  Breastfeeding is convenient. Breast milk is always available at the correct temperature and costs nothing.  Breastfeeding helps to burn calories and helps you lose the weight gained during pregnancy.  Breastfeeding makes your uterus contract to its prepregnancy size faster and slows bleeding (lochia) after you give birth.  Breastfeeding helps to lower your risk of developing type 2 diabetes mellitus, osteoporosis, and breast or ovarian cancer later in life.  Signs that your baby is hungry Early Signs of Hunger  Increased alertness or activity.  Stretching.  Movement of the head from side to side.  Movement of the head and opening of the mouth when the corner of the mouth or cheek is stroked (rooting).  Increased sucking sounds, smacking lips, cooing, sighing, or squeaking.  Hand-to-mouth movements.  Increased sucking of fingers or hands.  Late Signs of Hunger  Fussing.  Intermittent  crying.  Extreme Signs of Hunger Signs of extreme hunger will require calming and consoling before your baby will be able to breastfeed successfully. Do not wait for the following signs of extreme hunger to occur before you initiate breastfeeding:  Restlessness.  A loud, strong cry.  Screaming.  Breastfeeding basics  Breastfeeding Initiation  Find a comfortable place to sit or lie down, with your neck and back well supported.  Place a pillow or rolled up blanket under your baby to bring him or her to the level of your breast (if you are seated). Nursing pillows are specially designed to help support your arms and your baby while you breastfeed.  Make sure that your baby's abdomen is facing your abdomen.  Gently massage your breast. With your fingertips, massage from your chest wall toward your nipple in a circular motion. This encourages milk flow. You may need to continue this action during the feeding if your milk flows slowly.  Support your breast with 4 fingers underneath and your thumb above your nipple. Make sure your fingers are well away from your nipple and your babys mouth.  Stroke your baby's lips gently with your finger or nipple.  When your baby's mouth is open wide enough, quickly bring your baby to your breast, placing your entire nipple and as much of the colored area around your nipple (areola) as possible into your baby's mouth. ? More areola should be visible above your baby's upper lip than below the lower lip. ? Your baby's tongue should be between his or her lower gum and your breast.  Ensure that your baby's mouth is correctly positioned around your nipple (latched). Your baby's lips should create a seal on your breast and be turned out (everted).  It is common for your baby to suck about 2-3 minutes in order to start the flow of breast milk.  Latching Teaching your baby how to latch on to your breast properly is very important. An improper latch can cause  nipple pain and decreased milk supply for you and poor weight gain in your baby. Also, if your baby is not latched onto your nipple properly, he or she may swallow some air during feeding. This can make your baby fussy. Burping your baby when you switch breasts during the feeding can help to get rid of the air. However, teaching your baby to latch on properly is still the best way to prevent fussiness from swallowing air while breastfeeding. Signs that your baby has successfully latched on to your nipple:  Silent tugging or silent sucking, without causing you pain.  Swallowing heard between every 3-4 sucks.  Muscle movement above and in front of his or her ears while sucking.  Signs that your baby has not successfully latched on to nipple:  Sucking sounds or smacking sounds from your baby while breastfeeding.  Nipple pain.  If you think your baby has not latched on correctly, slip your finger into the corner of your babys mouth to break the suction and place it between your baby's gums. Attempt breastfeeding initiation again. Signs of Successful Breastfeeding Signs from your baby:  A gradual decrease in the number of sucks or complete cessation of sucking.  Falling asleep.  Relaxation of his or her body.  Retention of a small amount of milk in his or her mouth.  Letting go of your breast by himself or herself.  Signs from you:  Breasts that have increased in firmness, weight, and size 1-3 hours after feeding.  Breasts that are softer immediately after breastfeeding.  Increased milk volume, as well as a change in milk consistency and color by the fifth day of breastfeeding.  Nipples that are not sore, cracked, or bleeding.  Signs That Your Pecola Leisure is Getting Enough Milk  Wetting at least 1-2 diapers during the  first 24 hours after birth.  Wetting at least 5-6 diapers every 24 hours for the first week after birth. The urine should be clear or pale yellow by 5 days after  birth.  Wetting 6-8 diapers every 24 hours as your baby continues to grow and develop.  At least 3 stools in a 24-hour period by age 20 days. The stool should be soft and yellow.  At least 3 stools in a 24-hour period by age 76 days. The stool should be seedy and yellow.  No loss of weight greater than 10% of birth weight during the first 66 days of age.  Average weight gain of 4-7 ounces (113-198 g) per week after age 701 days.  Consistent daily weight gain by age 20 days, without weight loss after the age of 2 weeks.  After a feeding, your baby may spit up a small amount. This is common. Breastfeeding frequency and duration Frequent feeding will help you make more milk and can prevent sore nipples and breast engorgement. Breastfeed when you feel the need to reduce the fullness of your breasts or when your baby shows signs of hunger. This is called "breastfeeding on demand." Avoid introducing a pacifier to your baby while you are working to establish breastfeeding (the first 4-6 weeks after your baby is born). After this time you may choose to use a pacifier. Research has shown that pacifier use during the first year of a baby's life decreases the risk of sudden infant death syndrome (SIDS). Allow your baby to feed on each breast as long as he or she wants. Breastfeed until your baby is finished feeding. When your baby unlatches or falls asleep while feeding from the first breast, offer the second breast. Because newborns are often sleepy in the first few weeks of life, you may need to awaken your baby to get him or her to feed. Breastfeeding times will vary from baby to baby. However, the following rules can serve as a guide to help you ensure that your baby is properly fed:  Newborns (babies 53 weeks of age or younger) may breastfeed every 1-3 hours.  Newborns should not go longer than 3 hours during the day or 5 hours during the night without breastfeeding.  You should breastfeed your baby a  minimum of 8 times in a 24-hour period until you begin to introduce solid foods to your baby at around 61 months of age.  Breast milk pumping Pumping and storing breast milk allows you to ensure that your baby is exclusively fed your breast milk, even at times when you are unable to breastfeed. This is especially important if you are going back to work while you are still breastfeeding or when you are not able to be present during feedings. Your lactation consultant can give you guidelines on how long it is safe to store breast milk. A breast pump is a machine that allows you to pump milk from your breast into a sterile bottle. The pumped breast milk can then be stored in a refrigerator or freezer. Some breast pumps are operated by hand, while others use electricity. Ask your lactation consultant which type will work best for you. Breast pumps can be purchased, but some hospitals and breastfeeding support groups lease breast pumps on a monthly basis. A lactation consultant can teach you how to hand express breast milk, if you prefer not to use a pump. Caring for your breasts while you breastfeed Nipples can become dry, cracked, and sore while breastfeeding.  The following recommendations can help keep your breasts moisturized and healthy:  Avoid using soap on your nipples.  Wear a supportive bra. Although not required, special nursing bras and tank tops are designed to allow access to your breasts for breastfeeding without taking off your entire bra or top. Avoid wearing underwire-style bras or extremely tight bras.  Air dry your nipples for 3-17minutes after each feeding.  Use only cotton bra pads to absorb leaked breast milk. Leaking of breast milk between feedings is normal.  Use lanolin on your nipples after breastfeeding. Lanolin helps to maintain your skin's normal moisture barrier. If you use pure lanolin, you do not need to wash it off before feeding your baby again. Pure lanolin is not toxic  to your baby. You may also hand express a few drops of breast milk and gently massage that milk into your nipples and allow the milk to air dry.  In the first few weeks after giving birth, some women experience extremely full breasts (engorgement). Engorgement can make your breasts feel heavy, warm, and tender to the touch. Engorgement peaks within 3-5 days after you give birth. The following recommendations can help ease engorgement:  Completely empty your breasts while breastfeeding or pumping. You may want to start by applying warm, moist heat (in the shower or with warm water-soaked hand towels) just before feeding or pumping. This increases circulation and helps the milk flow. If your baby does not completely empty your breasts while breastfeeding, pump any extra milk after he or she is finished.  Wear a snug bra (nursing or regular) or tank top for 1-2 days to signal your body to slightly decrease milk production.  Apply ice packs to your breasts, unless this is too uncomfortable for you.  Make sure that your baby is latched on and positioned properly while breastfeeding.  If engorgement persists after 48 hours of following these recommendations, contact your health care provider or a Advertising copywriter. Overall health care recommendations while breastfeeding  Eat healthy foods. Alternate between meals and snacks, eating 3 of each per day. Because what you eat affects your breast milk, some of the foods may make your baby more irritable than usual. Avoid eating these foods if you are sure that they are negatively affecting your baby.  Drink milk, fruit juice, and water to satisfy your thirst (about 10 glasses a day).  Rest often, relax, and continue to take your prenatal vitamins to prevent fatigue, stress, and anemia.  Continue breast self-awareness checks.  Avoid chewing and smoking tobacco. Chemicals from cigarettes that pass into breast milk and exposure to secondhand smoke may  harm your baby.  Avoid alcohol and drug use, including marijuana. Some medicines that may be harmful to your baby can pass through breast milk. It is important to ask your health care provider before taking any medicine, including all over-the-counter and prescription medicine as well as vitamin and herbal supplements. It is possible to become pregnant while breastfeeding. If birth control is desired, ask your health care provider about options that will be safe for your baby. Contact a health care provider if:  You feel like you want to stop breastfeeding or have become frustrated with breastfeeding.  You have painful breasts or nipples.  Your nipples are cracked or bleeding.  Your breasts are red, tender, or warm.  You have a swollen area on either breast.  You have a fever or chills.  You have nausea or vomiting.  You have drainage other than breast  milk from your nipples.  Your breasts do not become full before feedings by the fifth day after you give birth.  You feel sad and depressed.  Your baby is too sleepy to eat well.  Your baby is having trouble sleeping.  Your baby is wetting less than 3 diapers in a 24-hour period.  Your baby has less than 3 stools in a 24-hour period.  Your baby's skin or the white part of his or her eyes becomes yellow.  Your baby is not gaining weight by 34 days of age. Get help right away if:  Your baby is overly tired (lethargic) and does not want to wake up and feed.  Your baby develops an unexplained fever. This information is not intended to replace advice given to you by your health care provider. Make sure you discuss any questions you have with your health care provider. Document Released: 05/06/2005 Document Revised: 10/18/2015 Document Reviewed: 10/28/2012 Elsevier Interactive Patient Education  2017 [employer].

## 2016-12-10 LAB — RPR: RPR: NONREACTIVE

## 2016-12-18 NOTE — Addendum Note (Signed)
Addendum  created 12/18/16 0944 by Bethena Midgetddono, Birda Didonato, MD   Anesthesia Attestations filed, Anesthesia Staff edited

## 2016-12-19 ENCOUNTER — Ambulatory Visit (HOSPITAL_COMMUNITY)
Admission: EM | Admit: 2016-12-19 | Discharge: 2016-12-19 | Disposition: A | Discharge status: Home / Self Care | Payer: 59 | Attending: Family Medicine | Admitting: Family Medicine

## 2016-12-19 ENCOUNTER — Encounter (HOSPITAL_COMMUNITY): Payer: Self-pay | Admitting: Emergency Medicine

## 2016-12-19 DIAGNOSIS — M79671 Pain in right foot: Secondary | ICD-10-CM | POA: Diagnosis not present

## 2016-12-19 NOTE — ED Triage Notes (Signed)
PT had a foot massage over a week ago. PT has had pain and swelling in right foot since then.

## 2016-12-19 NOTE — ED Provider Notes (Signed)
  [facility]   [id number] 12/19/16 Arrival Time: [id number]  ASSESSMENT & PLAN:  1. Foot pain, right    Observation. Declines imaging today. I agree regarding low suspicion of something such as a stress fracture. Ibuprofen regularly for the next few days. No flip-flops. Supportive shoe. Will f/u with PCP if not improving over the next week.  Reviewed expectations re: course of current medical issues. Questions answered. Outlined signs and symptoms indicating need for more acute intervention. Patient verbalized understanding. After Visit Summary given.   SUBJECTIVE:  Ann Roberts is a 36 y.o. female who presents with complaint of R foot pain for a little over a week. Just delivered child and received a foot massage before hospital discharge. Noticed foot pain along with mild swelling the day after. Ambulatory but discomfort worse with bending foot. Has been wearing flip-flops. No skin discoloration. Occasional ibuprofen; mild and temporary help. No previous injury to foot. No trauma reported.  ROS: As per HPI.   OBJECTIVE:  Vitals:   12/19/16 1016 12/19/16 1017  BP: 128/84   Pulse: 82   Resp: 16   Temp: 98.2 F (36.8 C)   TempSrc: Oral   SpO2: 96%   Weight:  179 lb (81.2 kg)  Height:  5\' 8"  (1.727 m)     General appearance: alert; no distress Extremities: R foot with mild dorsal swelling over midfoot when compared to the L; generalized tenderness to palpation; no skin changes or bruising; normal distal sensation Psychological:  alert and cooperative; normal mood and affect   No Known Allergies  PMHx, SurgHx, SocialHx, Medications, and Allergies were reviewed in the Visit Navigator and updated as appropriate.      Mardella LaymanHagler, Emmilynn Marut, MD 12/19/16 1324

## 2017-01-21 DIAGNOSIS — Z308 Encounter for other contraceptive management: Secondary | ICD-10-CM | POA: Diagnosis not present

## 2017-02-20 DIAGNOSIS — H5213 Myopia, bilateral: Secondary | ICD-10-CM | POA: Diagnosis not present

## 2017-04-27 IMAGING — US US MFM OB DETAIL+14 WK
1 series · 14 of 28 positions shown · non-contrast
Comparison: none

[Series 1: us mfm ob detail+14 wk · 107 acquisitions, 14 frames shown]
[im 4/107]
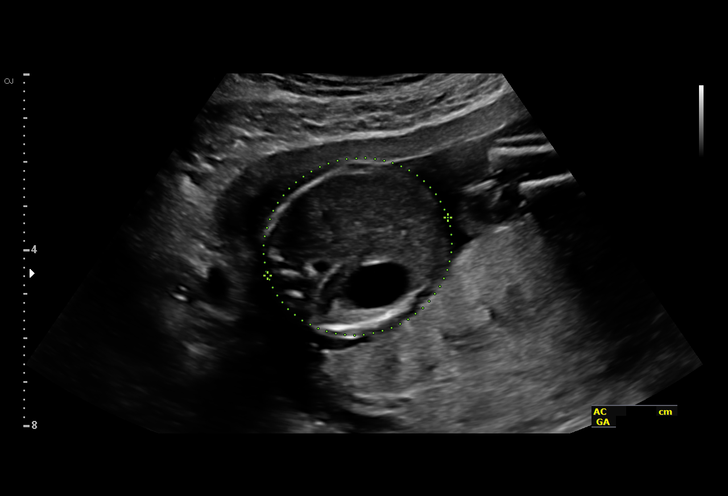
[im 12/107]
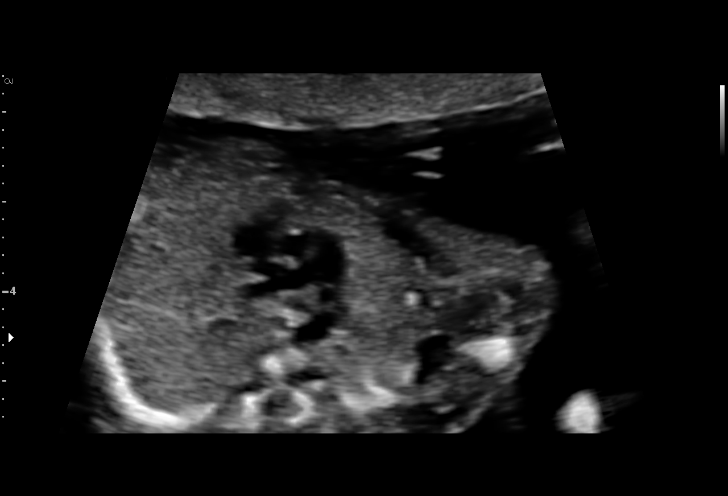
[im 20/107]
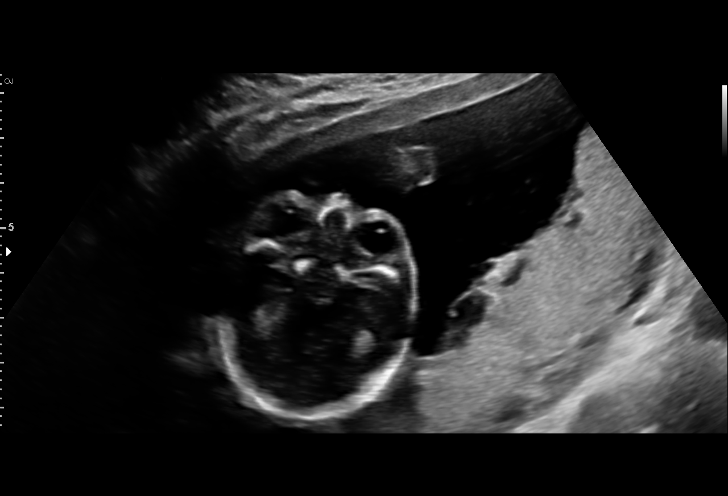
[im 28/107]
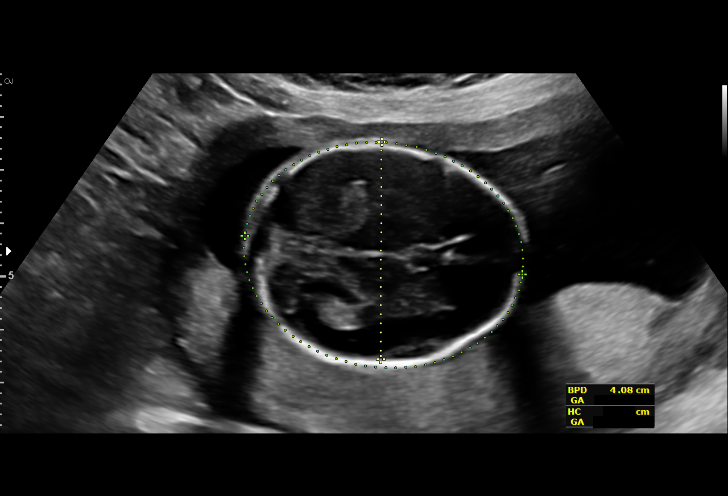
[im 36/107]
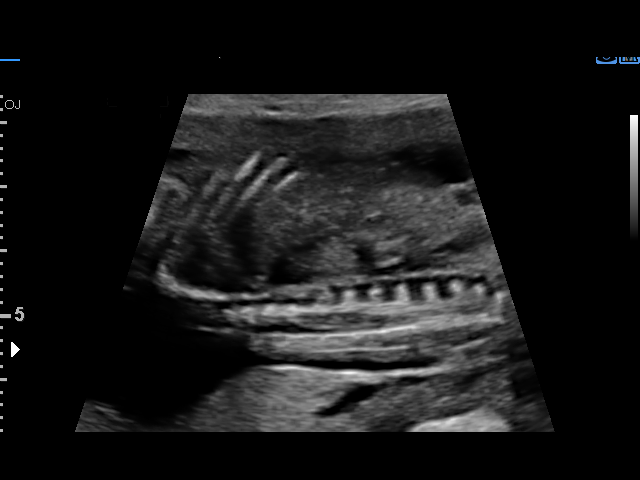
[im 44/107]
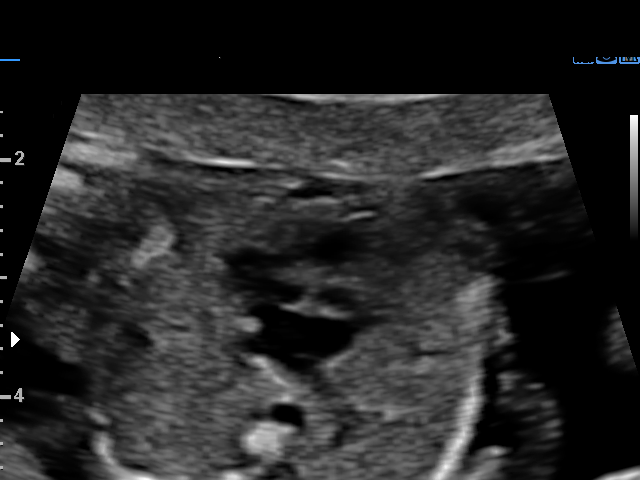
[im 52/107]
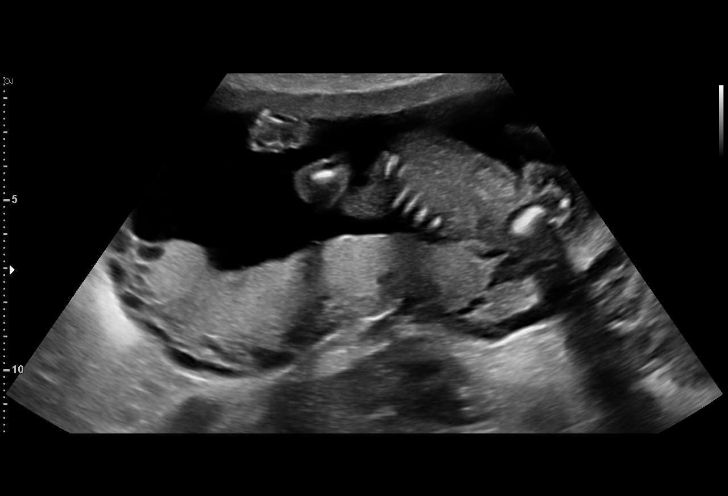
[im 59/107]
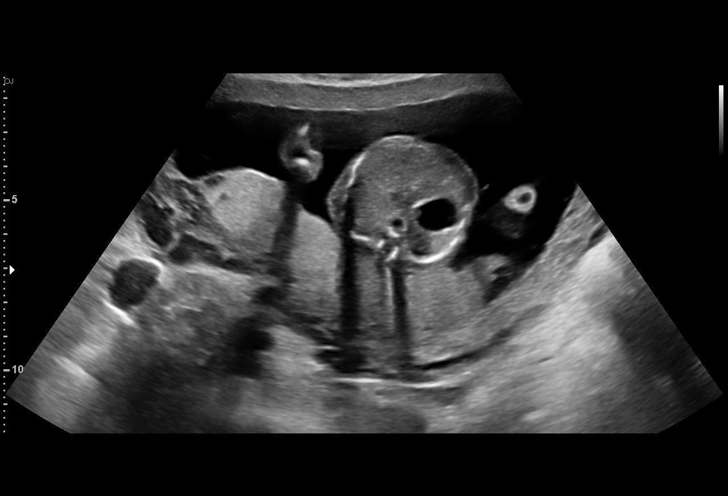
[im 67/107]
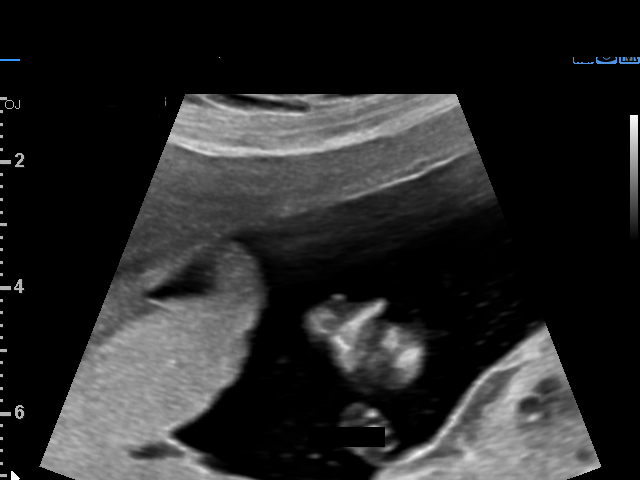
[im 75/107]
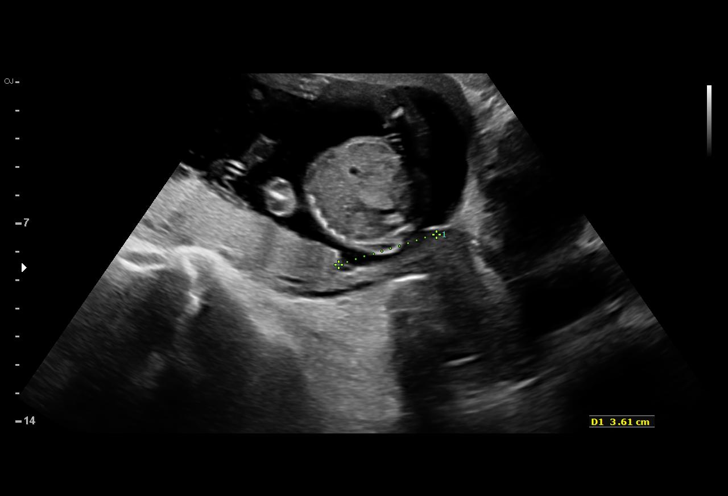
[im 83/107]
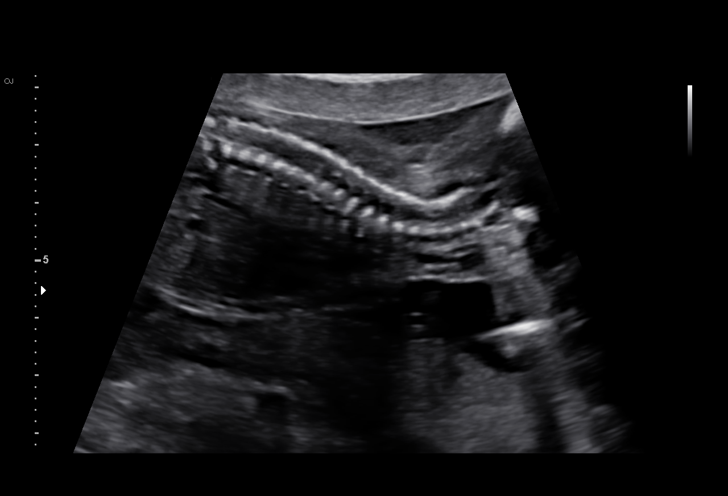
[im 91/107]
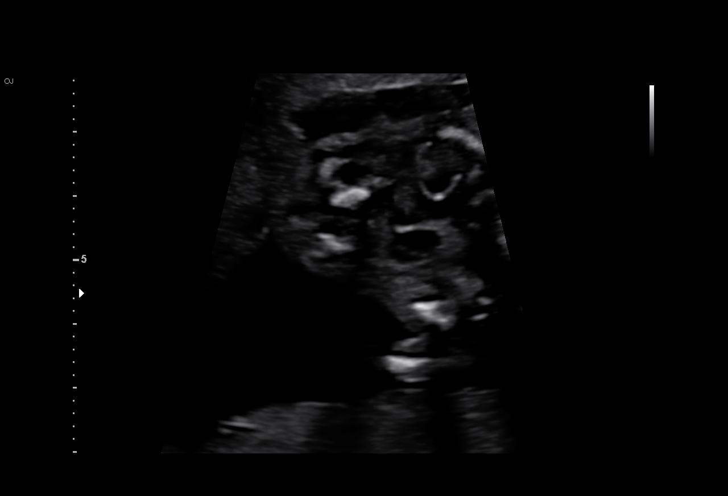
[im 99/107]
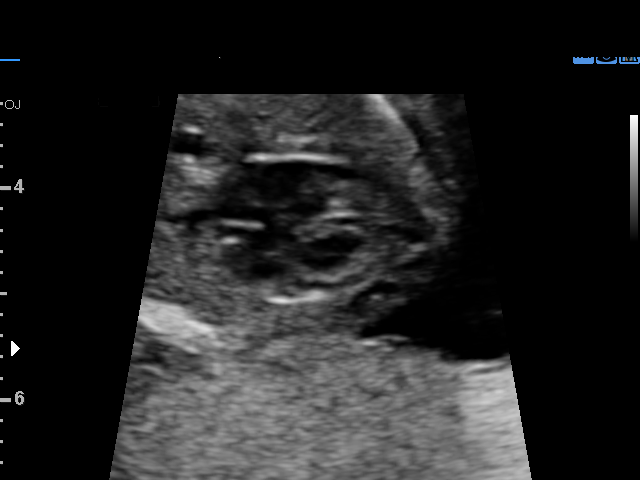
[im 107/107]
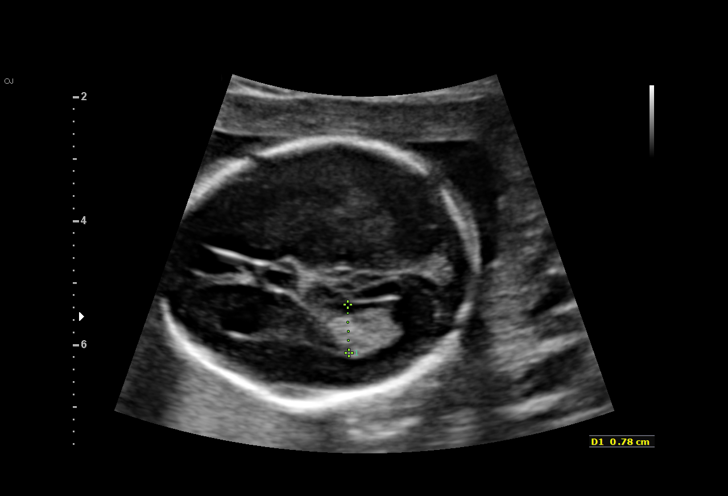

[14 of 28 positions shown; findings below may reference images not displayed]

Obstetrics &
Gynecology
[address]
[place].

1  VINI CADET            [id number]      [id number]     [id number]
Indications

18 weeks gestation of pregnancy
Encounter for antenatal screening for
malformations
Advanced maternal age multigravida (35),
second trimester-declined all testing
Previous pregnancy complicated by
chromosomal abnormality, antepartum
(Aicardi syndrome)
Poor obstetric history: Previous neonatal
death (liver failure, died 2 days after birth)
OB History

Blood Type:            Height:  5'7"   Weight (lb):  173       BMI:
Gravidity:    4         Term:   3        Prem:   0        SAB:   0
TOP:          0       Ectopic:  0        Living: 2
Fetal Evaluation

Num Of Fetuses:     1
Fetal Heart         146
Rate(bpm):
Cardiac Activity:   Observed
Presentation:       Breech
Placenta:           Posterior, above cervical os
P. Cord Insertion:  Visualized
Amniotic Fluid
AFI FV:      Subjectively within normal limits

Largest Pocket(cm)
4.27
Biometry

BPD:      40.4  mm     G. Age:  18w 2d         49  %    CI:        73.86   %    70 - 86
FL/HC:      18.7   %    15.8 - 18
HC:      149.3  mm     G. Age:  18w 0d         29  %    HC/AC:      1.13        1.07 -
AC:       132   mm     G. Age:  18w 5d         61  %    FL/BPD:     69.1   %
FL:       27.9  mm     G. Age:  18w 4d         54  %    FL/AC:      21.1   %    20 - 24
HUM:      26.7  mm     G. Age:  18w 3d         59  %
CER:      19.1  mm     G. Age:  18w 4d         59  %
NFT:       4.9  mm
CM:        5.9  mm

Est. FW:     246  gm      0 lb 9 oz     52  %
Gestational Age

LMP:           23w 0d        Date:  01/30/16                 EDD:   11/05/16
U/S Today:     18w 3d                                        EDD:   12/07/16
Best:          18w 2d     Det. By:  Early Ultrasound         EDD:   12/08/16
(04/17/16)
Anatomy

Cranium:               Appears normal         Aortic Arch:            Appears normal
Cavum:                 Appears normal         Ductal Arch:            Appears normal
Ventricles:            Appears normal         Diaphragm:              Appears normal
Choroid Plexus:        Appears normal         Stomach:                Appears normal, left
sided
Cerebellum:            Appears normal         Abdomen:                Appears normal
Posterior Fossa:       Appears normal         Abdominal Wall:         Appears nml (cord
insert, abd wall)
Nuchal Fold:           Appears normal         Cord Vessels:           Appears normal (3
vessel cord)
Face:                  Appears normal         Kidneys:                Appear normal
(orbits and profile)
Lips:                  Appears normal         Bladder:                Appears normal
Thoracic:              Appears normal         Spine:                  Appears normal
Heart:                 Appears normal         Upper Extremities:      Appears normal
(4CH, axis, and
situs)
RVOT:                  Appears normal         Lower Extremities:      Appears normal
LVOT:                  Appears normal

Other:  Fetus appears to be a female. Heels and rt  5th digit visualized.
Cervix Uterus Adnexa

Cervix
Length:            3.8  cm.
Normal appearance by transabdominal scan.

Uterus
No abnormality visualized.
Left Ovary
Within normal limits.

Right Ovary
Within normal limits.
Impression

Singleton intrauterine pregnancy at 18 weeks 2 days
gestation with fetal cardiac activity
Breech presentation
Posterior placenta without evidence of previa
Normal appearing fetal growth and amniotic fluid volume
No apparent birth defects noted on fetal anatomic survey
Normal appearing cervical length
Recommendations

Follow-up ultrasounds as clinically indicated.

## 2017-05-22 DIAGNOSIS — M629 Disorder of muscle, unspecified: Secondary | ICD-10-CM | POA: Diagnosis not present

## 2017-05-22 DIAGNOSIS — Z3040 Encounter for surveillance of contraceptives, unspecified: Secondary | ICD-10-CM | POA: Diagnosis not present

## 2017-05-22 DIAGNOSIS — R8290 Unspecified abnormal findings in urine: Secondary | ICD-10-CM | POA: Diagnosis not present

## 2017-05-22 DIAGNOSIS — Z124 Encounter for screening for malignant neoplasm of cervix: Secondary | ICD-10-CM | POA: Diagnosis not present

## 2017-05-22 DIAGNOSIS — Z6825 Body mass index (BMI) 25.0-25.9, adult: Secondary | ICD-10-CM | POA: Diagnosis not present

## 2017-05-22 DIAGNOSIS — Z01419 Encounter for gynecological examination (general) (routine) without abnormal findings: Secondary | ICD-10-CM | POA: Diagnosis not present

## 2017-05-22 DIAGNOSIS — Z8349 Family history of other endocrine, nutritional and metabolic diseases: Secondary | ICD-10-CM | POA: Diagnosis not present

## 2017-07-09 DIAGNOSIS — Z319 Encounter for procreative management, unspecified: Secondary | ICD-10-CM | POA: Diagnosis not present

## 2017-07-23 DIAGNOSIS — J069 Acute upper respiratory infection, unspecified: Secondary | ICD-10-CM | POA: Diagnosis not present

## 2017-07-23 DIAGNOSIS — J101 Influenza due to other identified influenza virus with other respiratory manifestations: Secondary | ICD-10-CM | POA: Diagnosis not present

## 2017-09-11 DIAGNOSIS — Z0000 Encounter for general adult medical examination without abnormal findings: Secondary | ICD-10-CM | POA: Diagnosis not present

## 2017-09-11 DIAGNOSIS — Z6824 Body mass index (BMI) 24.0-24.9, adult: Secondary | ICD-10-CM | POA: Diagnosis not present

## 2017-09-23 MED FILL — ESCITALOPRAM 10 MG TABLET: 10 | 90 days supply | Qty: 90 | Fill #0

## 2017-10-28 ENCOUNTER — Ambulatory Visit: Payer: 59 | Attending: Obstetrics and Gynecology | Admitting: Physical Therapy

## 2017-10-28 ENCOUNTER — Other Ambulatory Visit: Payer: Self-pay

## 2017-10-28 ENCOUNTER — Encounter: Payer: Self-pay | Admitting: Physical Therapy

## 2017-10-28 DIAGNOSIS — M6281 Muscle weakness (generalized): Secondary | ICD-10-CM | POA: Insufficient documentation

## 2017-10-28 DIAGNOSIS — R279 Unspecified lack of coordination: Secondary | ICD-10-CM | POA: Insufficient documentation

## 2017-10-28 NOTE — Patient Instructions (Signed)
Access Code: [email or web address]  URL: [email or web address]  Date: 10/28/2017  Prepared by: Eulis Fosterheryl Gray   Exercises  Quadruped Pelvic Floor Contraction with Opposite Arm and Leg Lift - 10 reps - 1 sets - 5 hold - 1x daily - 7x weekly  Seated Pelvic Floor Contraction - 10 reps - 1 sets - 5 hold - 3x daily - 7x weekly  [facility] [address], [address] [place], [place] [address] Phone # [phone] Fax [phone]

## 2017-10-28 NOTE — Therapy (Signed)
[facility] [address] [place], [place], [address] Phone: [phone]   Fax:  [phone]  Physical Therapy Evaluation  Patient Details  Name: Ann BassetJennifer D Roberts MRN: [id number] Date of Birth: [birth year] Referring Provider: Dr. Dois DavenportSandra Rivard   Encounter Date: 10/28/2017  PT End of Session - 10/28/17 1002    Visit Number  1    Date for PT Re-Evaluation  02/27/18    PT Start Time  0925    PT Stop Time  1000    PT Time Calculation (min)  35 min    Activity Tolerance  Patient tolerated treatment well    Behavior During Therapy  [facility] for tasks assessed/performed       Past Medical History:  Diagnosis Date  . AMA (advanced maternal age) multigravida 35+   . H/O toxoplasmosis    pt. denies  . H/O varicella   . History of cystitis   . Hx: UTI (urinary tract infection)    x 1  . Hx: UTI (urinary tract infection)   . Infertility, female   . No pertinent past medical history   . Status post epidural steroid injection    ruptured disc L5 and S1    Past Surgical History:  Procedure Laterality Date  . ANTERIOR CRUCIATE LIGAMENT REPAIR  1996   R  . epidural steroids  2007   x2 for ruptured disc in back  . TONSILLECTOMY  2002  . WISDOM TOOTH EXTRACTION      There were no vitals filed for this visit.   Subjective Assessment - 10/28/17 0927    Subjective  Patient has had 4 babies. Leakage with coughing, sneezing, jumping jacks, Sometimes wears a pad.  Pateint on her cycle.      Patient Stated Goals  how to improve urinary leakage with coughing, sneezing, jumping    Currently in Pain?  No/denies         [facility] - 10/28/17 0001      Assessment   Medical Diagnosis  M62.9 Disorder of muscle, unspecified    Referring Provider  Dr. Dois DavenportSandra Rivard    Onset Date/Surgical Date  07/19/11    Prior Therapy  None      Precautions   Precautions  None      Restrictions   Weight Bearing Restrictions  No       Balance Screen   Has the patient fallen in the past 6 months  No    Has the patient had a decrease in activity level because of a fear of falling?   No    Is the patient reluctant to leave their home because of a fear of falling?   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    Leisure  exercise      Cognition   Overall Cognitive Status  Within Functional Limits for tasks assessed      Posture/Postural Control   Posture/Postural Control  No significant limitations    Posture Comments  holds children on left hip      ROM / Strength   AROM / PROM / Strength  AROM;PROM;Strength      AROM   Overall AROM   Within functional limits for tasks performed      PROM  Overall PROM   Within functional limits for tasks performed      Strength   Overall Strength Comments  abdominal strength is 2/5    Right Hip External Rotation   4/5    Right Hip ADduction  4/5    Left Hip External Rotation  4/5      Palpation   SI assessment   ASIS are equal      Special Tests    Special Tests  Hip Special Tests      Trendelenburg Test   Findings  Positive    Side  Left    Comments  right hip drops      Transfers   Transfers  Independent with all Transfers      Ambulation/Gait   Ambulation/Gait  No                Objective measurements completed on examination: See above findings.    Pelvic Floor Special Questions - 10/28/17 0001    Prior Pregnancies  Yes    Number of Pregnancies  4    Number of Vaginal Deliveries  4    Any difficulty with labor and deliveries  Yes first child died several days after    Diastasis Recti  initial is 3 fingers but when contract abdomen is none    Currently Sexually Active  Yes    Is this Painful  Yes deeper    Marinoff Scale  discomfort that does not affect completion    Urinary Leakage   Yes    Pad use  sometimes    Activities that cause leaking  Coughing;Sneezing;Laughing jumping    Urinary urgency  No    Fecal incontinence  No incomplete bowels, strains               PT Education - 10/28/17 1001    Education Details  Access Code: [email or web address]    Person(s) Educated  Patient    Methods  Explanation;Demonstration;Verbal cues;Handout    Comprehension  Returned demonstration;Verbalized understanding       PT Short Term Goals - 10/28/17 1009      PT SHORT TERM GOAL #1   Title  independent with initial HEP    Time  4    Period  Weeks    Status  New    Target Date  11/25/17      PT SHORT TERM GOAL #2   Title  finish assessment of pelvic floor muscle strength    Time  4    Period  Weeks    Status  New    Target Date  11/25/17      PT SHORT TERM GOAL #3   Title  urninary leakage decreased by 25% with coughing, laughing    Time  4    Period  Weeks    Status  New    Target Date  11/25/17        PT Long Term Goals - 10/28/17 1011      PT LONG TERM GOAL #1   Title  She will be independent with all HEP    Time  4    Period  Weeks    Status  New    Target Date  02/27/18      PT LONG TERM GOAL #2   Title  urniary leakage with coughing and laughing decreased >/ 80%    Time  4    Period  Months    Status  New    Target  Date  02/27/18      PT LONG TERM GOAL #3   Title  able to fully evacuate her bowels due to improved strength in the pelvic floor muscles    Time  4    Period  Weeks    Status  New    Target Date  02/27/18      PT LONG TERM GOAL #4   Title  urinary leakage with jumping decreased >/= 70% due to improved pelvic floor and abdominal strength    Time  4    Period  Months    Status  New    Target Date  02/27/18      PT LONG TERM GOAL #5   Title  -----             Plan - 10/28/17 1002    Clinical Impression Statement  Patient is a 37 year old female with poor vaginal tone and urinary stress incontinence for the past 4 year  since she had her first child.  Patient reports discomfort with intercourse during deep penetration.  Patient reports she will leak urine with sneezing, laughing, coughing and jumping.  Patient has 3 fingers width diastasis recti but during a curl up it comes together.  Patient abdominal strength is 2/5.  Bilateral hip abduction, hip external rotation and right hip adduction is 4/5. Patient reports she has difficulty fully evacuating her bowels and has to strain. Pelvic floor was not assessed for strength due to patient being on her cycle.  The pelvic floor will be assessed in the future. Patient will benefit from skilled therapy to improve strength and coordination to reduce leakage.     History and Personal Factors relevant to plan of care:  last child born 10 months ago    Clinical Presentation  Stable    Clinical Presentation due to:  stable condition    Clinical Decision Making  Low    Rehab Potential  Excellent    Clinical Impairments Affecting Rehab Potential  none    PT Frequency  1x / week    PT Duration  Other (comment) 4 months    PT Treatment/Interventions  Biofeedback;Therapeutic activities;Therapeutic exercise;Patient/family education;Neuromuscular re-education;Manual techniques    PT Next Visit Plan  finish assessing the pelvis floor; core strength; toileting technique    Consulted and Agree with Plan of Care  Patient       Patient will benefit from skilled therapeutic intervention in order to improve the following deficits and impairments:  Increased muscle spasms, Decreased strength, Decreased activity tolerance  Visit Diagnosis: Muscle weakness (generalized) - Plan: PT plan of care cert/re-cert  Unspecified lack of coordination - Plan: PT plan of care cert/re-cert     Problem List Patient Active Problem List   Diagnosis Date Noted  . Vaginal delivery 2016/12/27  . Neonatal death in prior pregnancy, currently pregnant   . Agenesis of corpus callosum of fetus, affecting  antepartum care of mother   . Maternal age 67+, multigravida, antepartum   . Hx of neonatal death in prior pregnancy--occurred 2 days after delivery 03/06/2012    Eulis Foster, PT 10/28/17 10:15 AM   [facility] [address], [address] [place], [place], [address] Phone: [phone]   Fax:  [phone]  Name: Ann Roberts MRN: [id number] Date of Birth: 1981/03/17

## 2017-11-13 ENCOUNTER — Encounter: Payer: Self-pay | Admitting: Physical Therapy

## 2017-11-13 ENCOUNTER — Ambulatory Visit: Payer: 59 | Admitting: Physical Therapy

## 2017-11-13 DIAGNOSIS — M6281 Muscle weakness (generalized): Secondary | ICD-10-CM | POA: Diagnosis not present

## 2017-11-13 DIAGNOSIS — R279 Unspecified lack of coordination: Secondary | ICD-10-CM

## 2017-11-13 NOTE — Patient Instructions (Addendum)
Toileting Techniques for Bowel Movements (Defecation) Using your belly (abdomen) and pelvic floor muscles to have a bowel movement is usually instinctive.  Sometimes people can have problems with these muscles and have to relearn proper defecation (emptying) techniques.  If you have weakness in your muscles, organs that are falling out, decreased sensation in your pelvis, or ignore your urge to go, you may find yourself straining to have a bowel movement.  You are straining if you are: . holding your breath or taking in a huge gulp of air and holding it  . keeping your lips and jaw tensed and closed tightly . turning red in the face because of excessive pushing or forcing . developing or worsening your  hemorrhoids . getting faint while pushing . not emptying completely and have to defecate many times a day  If you are straining, you are actually making it harder for yourself to have a bowel movement.  Many people find they are pulling up with the pelvic floor muscles and closing off instead of opening the anus. Due to lack pelvic floor relaxation and coordination the abdominal muscles, one has to work harder to push the feces out.  Many people have never been taught how to defecate efficiently and effectively.  Notice what happens to your body when you are having a bowel movement.  While you are sitting on the toilet pay attention to the following areas: . Jaw and mouth position . Angle of your hips   . Whether your feet touch the ground or not . Arm placement  . Spine position . Waist . Belly tension . Anus (opening of the anal canal)  An Evacuation/Defecation Plan   Here are the 4 basic points:  1. Lean forward enough for your elbows to rest on your knees 2. Support your feet on the floor or use a low stool if your feet don't touch the floor  3. Push out your belly as if you have swallowed a beach ball-you should feel a widening of your waist 4. Open and relax your pelvic floor muscles,  rather than tightening around the anus      The following conditions my require modifications to your toileting posture:  . If you have had surgery in the past that limits your back, hip, pelvic, knee or ankle flexibility . Constipation   Your healthcare practitioner may make the following additional suggestions and adjustments:  1) Sit on the toilet  a) Make sure your feet are supported. b) Notice your hip angle and spine position-most people find it effective to lean forward or raise their knees, which can help the muscles around the anus to relax  c) When you lean forward, place your forearms on your thighs for support  2) Relax suggestions a) Breath deeply in through your nose and out slowly through your mouth as if you are smelling the flowers and blowing out the candles. b) To become aware of how to relax your muscles, contracting and releasing muscles can be helpful.  Pull your pelvic floor muscles in tightly by using the image of holding back gas, or closing around the anus (visualize making a circle smaller) and lifting the anus up and in.  Then release the muscles and your anus should drop down and feel open. Repeat 5 times ending with the feeling of relaxation. c) Keep your pelvic floor muscles relaxed; let your belly bulge out. d) The digestive tract starts at the mouth and ends at the anal opening, so be   sure to relax both ends of the tube.  Place your tongue on the roof of your mouth with your teeth separated.  This helps relax your mouth and will help to relax the anus at the same time.  3) Empty (defecation) a) Keep your pelvic floor and sphincter relaxed, then bulge your anal muscles.  Make the anal opening wide.  b) Stick your belly out as if you have swallowed a beach ball. c) Make your belly wall hard using your belly muscles while continuing to breathe. Doing this makes it easier to open your anus. d) Breath out and give a grunt (or try using other sounds such as  ahhhh, shhhhh, ohhhh or grrrrrrr).  4) Finish a) As you finish your bowel movement, pull the pelvic floor muscles up and in.  This will leave your anus in the proper place rather than remaining pushed out and down. If you leave your anus pushed out and down, it will start to feel as though that is normal and give you incorrect signals about needing to have a bowel movement.    Ann Roberts, PT [facility] [address] [place], [place] [address] Supine    Lie flat with pillow support. Allow body's muscles to relax. Place hands on belly. Inhale slowly and deeply for __3 seconds, so hands move up. Then take _3__ seconds to exhale. Repeat _5__ times. Do _2__ times a day.   Copyright  VHI. All rights reserved.  Sitting    Sit comfortably. Allow body's muscles to relax. Place hands on belly. Inhale slowly and deeply for __3_ seconds, so hands move out. Then take _3__ seconds to exhale. Repeat _3__ times. When having a bowel movement.   Copyright  VHI. All rights reserved.  About Abdominal Massage  Abdominal massage, also called external colon massage, is a self-treatment circular massage technique that can reduce and eliminate gas and ease constipation. The colon naturally contracts in waves in a clockwise direction starting from inside the right hip, moving up toward the ribs, across the belly, and down inside the left hip.  When you perform circular abdominal massage, you help stimulate your colon's normal wave pattern of movement called peristalsis.  It is most beneficial when done after eating.  Positioning You can practice abdominal massage with oil while lying down, or in the shower with soap.  Some people find that it is just as effective to do the massage through clothing while sitting or standing.  How to Massage Start by placing your finger tips or knuckles on your right side, just inside your hip bone.  . Make small circular movements while  you move upward toward your rib cage.   . Once you reach the bottom right side of your rib cage, take your circular movements across to the left side of the bottom of your rib cage.  . Next, move downward until you reach the inside of your left hip bone.  This is the path your feces travel in your colon. . Continue to perform your abdominal massage in this pattern for 10 minutes each day.     You can apply as much pressure as is comfortable in your massage.  Start gently and build pressure as you continue to practice.  Notice any areas of pain as you massage; areas of slight pain may be relieved as you massage, but if you have areas of significant or intense pain, consult with your healthcare provider.  Other Considerations . General physical  activity including bending and stretching can have a beneficial massage-like effect on the colon.  Deep breathing can also stimulate the colon because breathing deeply activates the same nervous system that supplies the colon.   . Abdominal massage should always be used in combination with a bowel-conscious diet that is high in the proper type of fiber for you, fluids (primarily water), and a regular exercise program. Quick Contraction: Gravity Eliminated (Supine)    Lie flat. Quickly squeeze then fully relax pelvic floor. Perform _1__ sets of __5_. Rest for _1__ seconds between sets. Do _2__ times a day.   Copyright  VHI. All rights reserved.   Slow Contraction: Gravity Eliminated (Supine)    Lie flat. Slowly squeeze pelvic floor for _3__ seconds. Rest for _10__ seconds. Repeat _5__ times. Do __2_ times a day.   Copyright  VHI. All rights reserved.  Quick Contraction: Gravity Resisted (Sitting)    Sitting, quickly squeeze then fully relax pelvic floor. Perform _1__ sets of __5_. Rest for __1_ seconds between sets. Do _2__ times a day.  Copyright  VHI. All rights reserved.  Slow Contraction: Gravity Resisted (Sitting)    Sitting,  slowly squeeze pelvic floor for __3_ seconds. Rest for __10_ seconds. Repeat _5__ times. Do __2_ times a day.  Copyright  VHI. All rights reserved.  Bracing With Arm / Leg Raise (Quadruped)    On hands and knees find neutral spine. Tighten pelvic floor and abdominals and hold. Alternating, lift arm to shoulder level and opposite leg to hip level. Repeat 10___ times. Do __1_ times a day.   Copyright  VHI. All rights reserved.  [facility] [address], [address] [place], [place] [address] Phone # [phone] Fax [phone]

## 2017-11-13 NOTE — Therapy (Signed)
[facility] [address] [place], [place], [address] Phone: [phone]   Fax:  [phone]  Physical Therapy Treatment  Patient Details  Name: Ann Roberts MRN: [id number] Date of Birth: 04/02/1981 Referring Provider: Dr. Dois Davenport Rivard   Encounter Date: 11/13/2017  PT End of Session - 11/13/17 0928    Visit Number  2    Date for PT Re-Evaluation  02/27/18    Authorization Type  UMR    PT Start Time  0845    PT Stop Time  0925    PT Time Calculation (min)  40 min    Activity Tolerance  Patient tolerated treatment well    Behavior During Therapy  [facility] for tasks assessed/performed       Past Medical History:  Diagnosis Date  . AMA (advanced maternal age) multigravida 35+   . H/O toxoplasmosis    pt. denies  . H/O varicella   . History of cystitis   . Hx: UTI (urinary tract infection)    x 1  . Hx: UTI (urinary tract infection)   . Infertility, female   . No pertinent past medical history   . Status post epidural steroid injection    ruptured disc L5 and S1    Past Surgical History:  Procedure Laterality Date  . ANTERIOR CRUCIATE LIGAMENT REPAIR  1996   R  . epidural steroids  2007   x2 for ruptured disc in back  . TONSILLECTOMY  2002  . WISDOM TOOTH EXTRACTION      There were no vitals filed for this visit.  Subjective Assessment - 11/13/17 0849    Subjective  Patient felt good after the evaluation and have been doing the exercises. My back bothers me.     Patient Stated Goals  how to improve urinary leakage with coughing, sneezing, jumping    Currently in Pain?  Yes    Pain Score  5     Pain Location  Back    Pain Orientation  Lower    Pain Descriptors / Indicators  Aching;Dull    Pain Type  Acute pain    Pain Onset  More than a month ago    Pain Frequency  Intermittent    Aggravating Factors   movement    Pain Relieving Factors  stretching    Multiple Pain Sites  No                     Pelvic Floor Special Questions - 11/13/17 0001    Pelvic Floor Internal Exam  Patient confirma identification and approves PT to assess the pelvic floor and treat    Exam Type  Vaginal    Palpation  tightness located on the left pelvic floor muscles; tightness along the introitus    Strength  weak squeeze, no lift    Strength # of reps  5    Tone  trouble relaxing after contraction        [facility] Adult PT Treatment/Exercise - 11/13/17 0001      Neuro Re-ed    Neuro Re-ed Details   pelvic floor contraction with index finger in the vaginal introitus with tactile cues to contract and pull up in supine and quick contraction; diaphgramatic breathing in supine with tactile cues to expand the abdomen      Manual Therapy   Manual Therapy  Internal Pelvic Floor    Internal Pelvic Floor  soft tissue work along the left pelvic floor  muscles and bil. introitus area             PT Education - 11/13/17 0926    Education Details  abdominal massage; toileting technique; pelvic floor contraction; diaphragmatic breathing    Person(s) Educated  Patient    Methods  Explanation;Demonstration;Verbal cues;Handout    Comprehension  Returned demonstration;Verbalized understanding       PT Short Term Goals - 11/13/17 0931      PT SHORT TERM GOAL #1   Title  independent with initial HEP    Time  4    Period  Weeks    Status  On-going      PT SHORT TERM GOAL #2   Title  finish assessment of pelvic floor muscle strength    Time  4    Period  Weeks    Status  Achieved      PT SHORT TERM GOAL #3   Title  urninary leakage decreased by 25% with coughing, laughing    Time  4    Period  Weeks    Status  On-going        PT Long Term Goals - 10/28/17 1011      PT LONG TERM GOAL #1   Title  She will be independent with all HEP    Time  4    Period  Weeks    Status  New    Target Date  02/27/18      PT LONG TERM GOAL #2   Title  urniary leakage with coughing  and laughing decreased >/ 80%    Time  4    Period  Months    Status  New    Target Date  02/27/18      PT LONG TERM GOAL #3   Title  able to fully evacuate her bowels due to improved strength in the pelvic floor muscles    Time  4    Period  Weeks    Status  New    Target Date  02/27/18      PT LONG TERM GOAL #4   Title  urinary leakage with jumping decreased >/= 70% due to improved pelvic floor and abdominal strength    Time  4    Period  Months    Status  New    Target Date  02/27/18      PT LONG TERM GOAL #5   Title  -----            Plan - 11/13/17 4098    Clinical Impression Statement  Patient pelvic floor strength is 2/5 with no lift and more contraction anterior and posterior.  After manual work she was able  to perfrom a slight circular contraction.   Patient needed tactile cues to perform diaphragmatic breathing while keeping her shoulders relaxed. Patient understands how to perform pelvic floor contraction for 3 seconds but needs awhile to fully relax. Patient reports she has difficulty with fully emptying her bowels. Patient will benefi tfrom skilled therapy to improve strength and coordination to reduce leakage.     Rehab Potential  Excellent    Clinical Impairments Affecting Rehab Potential  none    PT Frequency  1x / week    PT Duration  Other (comment) 4 months    PT Treatment/Interventions  Biofeedback;Therapeutic activities;Therapeutic exercise;Patient/family education;Neuromuscular re-education;Manual techniques    PT Next Visit Plan  core strength with pelvic floor EMG; soft tissue work internally    Recommended Other Services  MD signed initial eval    Consulted and Agree with Plan of Care  Patient       Patient will benefit from skilled therapeutic intervention in order to improve the following deficits and impairments:  Increased muscle spasms, Decreased strength, Decreased activity tolerance  Visit Diagnosis: Muscle weakness  (generalized)  Unspecified lack of coordination     Problem List Patient Active Problem List   Diagnosis Date Noted  . Vaginal delivery 12/09/2016  . Neonatal death in prior pregnancy, currently pregnant   . 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 Maternal age 39+, multigravida, antepartum   . Hx of neonatal death in prior pregnancy--occurred 2 days after delivery 03/06/2012    Eulis FosterCheryl Gray, PT 11/13/17 9:32 AM   [facility] [address] [place], [place], [address] Phone: [phone]   Fax:  [phone]  Name: Ann Roberts MRN: [id number] Date of Birth: Oct 03, 1980

## 2017-11-19 ENCOUNTER — Encounter: Payer: Self-pay | Admitting: Physical Therapy

## 2017-11-19 ENCOUNTER — Ambulatory Visit: Payer: 59 | Attending: Obstetrics and Gynecology | Admitting: Physical Therapy

## 2017-11-19 DIAGNOSIS — R279 Unspecified lack of coordination: Secondary | ICD-10-CM | POA: Insufficient documentation

## 2017-11-19 DIAGNOSIS — M6281 Muscle weakness (generalized): Secondary | ICD-10-CM | POA: Insufficient documentation

## 2017-11-19 NOTE — Patient Instructions (Addendum)
Combination: Slow Hold With Quick Flick (Hook-Lying)    Lie with hips and knees bent. Tighten pelvic floor and Hold for _5__ seconds. With releasing contraction, do __2_ quick flicks. Relax. Repeat _5__ times. Do _2__ times a day. Place pillow under knees and relax legs.    Copyright  VHI. All rights reserved.  [facility] [address], [address] [place], [place] [address] Phone # [phone] Fax [phone]

## 2017-11-19 NOTE — Therapy (Signed)
[facility] [address] [place], [place], [address] Phone: [phone]   Fax:  [phone]  Physical Therapy Treatment  Patient Details  Name: Ann Roberts MRN: [id number] Date of Birth: 30-Nov-1980 Referring Provider: Dr. Dois DavenportSandra Rivard   Encounter Date: 11/19/2017  PT End of Session - 11/19/17 0933    Visit Number  3    Date for PT Re-Evaluation  02/27/18    Authorization Type  UMR    PT Start Time  0930    PT Stop Time  1010    PT Time Calculation (min)  40 min    Activity Tolerance  Patient tolerated treatment well    Behavior During Therapy  [facility] for tasks assessed/performed       Past Medical History:  Diagnosis Date  . AMA (advanced maternal age) multigravida 35+   . H/O toxoplasmosis    pt. denies  . H/O varicella   . History of cystitis   . Hx: UTI (urinary tract infection)    x 1  . Hx: UTI (urinary tract infection)   . Infertility, female   . No pertinent past medical history   . Status post epidural steroid injection    ruptured disc L5 and S1    Past Surgical History:  Procedure Laterality Date  . ANTERIOR CRUCIATE LIGAMENT REPAIR  1996   R  . epidural steroids  2007   x2 for ruptured disc in back  . TONSILLECTOMY  2002  . WISDOM TOOTH EXTRACTION      There were no vitals filed for this visit.  Subjective Assessment - 11/19/17 0932    Subjective  My back has been hurting.  I am not having to brace myself as much with jumping jacks.     Patient Stated Goals  how to improve urinary leakage with coughing, sneezing, jumping    Currently in Pain?  Yes    Pain Score  4     Pain Location  Back    Pain Orientation  Lower    Pain Descriptors / Indicators  Aching;Dull    Pain Type  Acute pain    Pain Onset  More than a month ago    Pain Frequency  Intermittent    Aggravating Factors   movement    Pain Relieving Factors  stretching, improves through the day    Multiple Pain Sites  No                     Pelvic Floor Special Questions - 11/19/17 0001    Biofeedback  sidely resting tone is 8 uv; diaphragmatic breathing; supine pelvic floor contraction for 5 seconds with control , needs tactile cues to contract lower abdominals for 5 second    Biofeedback sensor type  Surface vaginal                PT Education - 11/19/17 1012    Education Details  pelvic floor contraction with lower abdominal contraction in [employer]    Person(s) Educated  Patient    Methods  Explanation;Demonstration;Verbal cues;Handout    Comprehension  Returned demonstration;Verbalized understanding       PT Short Term Goals - 11/19/17 0934      PT SHORT TERM GOAL #1   Title  independent with initial HEP    Time  4    Period  Weeks    Status  Achieved      PT SHORT TERM GOAL #3  Title  urninary leakage decreased by 25% with coughing, laughing    Time  4    Period  Weeks    Status  Achieved        PT Long Term Goals - 10/28/17 1011      PT LONG TERM GOAL #1   Title  She will be independent with all HEP    Time  4    Period  Weeks    Status  New    Target Date  02/27/18      PT LONG TERM GOAL #2   Title  urniary leakage with coughing and laughing decreased >/ 80%    Time  4    Period  Months    Status  New    Target Date  02/27/18      PT LONG TERM GOAL #3   Title  able to fully evacuate her bowels due to improved strength in the pelvic floor muscles    Time  4    Period  Weeks    Status  New    Target Date  02/27/18      PT LONG TERM GOAL #4   Title  urinary leakage with jumping decreased >/= 70% due to improved pelvic floor and abdominal strength    Time  4    Period  Months    Status  New    Target Date  02/27/18      PT LONG TERM GOAL #5   Title  -----            Plan - 11/19/17 1610    Clinical Impression Statement  Resting tone 6 uv in sidely and had trouble bringing it lower with diaphragmatic breathing.  Patient needs tactile  cues to engage the lower abdominals with pelvic floor contraction. Patient has good control with quick flicks and able to relax back to 6 uv.  Patietn has difficulty with control of pelvic floor contraction  Patient has better control with eyes closed.  Patient is still having difficiulty with emptying her bowels due to not relaxing her pelvic floor.     Rehab Potential  Excellent    Clinical Impairments Affecting Rehab Potential  none    PT Frequency  1x / week    PT Duration  Other (comment) 4 months    PT Treatment/Interventions  Biofeedback;Therapeutic activities;Therapeutic exercise;Patient/family education;Neuromuscular re-education;Manual techniques    PT Next Visit Plan  core strength with pelvic floor EMG; soft tissue work internally    Consulted and Agree with Plan of Care  Patient       Patient will benefit from skilled therapeutic intervention in order to improve the following deficits and impairments:  Increased muscle spasms, Decreased strength, Decreased activity tolerance  Visit Diagnosis: Muscle weakness (generalized)  Unspecified lack of coordination     Problem List Patient Active Problem List   Diagnosis Date Noted  . Vaginal delivery 12/20/16  . Neonatal death in prior pregnancy, currently pregnant   . 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 Maternal age 51+, multigravida, antepartum   . Hx of neonatal death in prior pregnancy--occurred 2 days after delivery 03/06/2012    Ann Roberts, PT 11/19/17 10:15 AM   [facility] [address], [address] [place], [place], [address] Phone: [phone]   Fax:  [phone]  Name: Ann Roberts MRN: [id number] Date of Birth: 1980/08/17

## 2017-11-28 ENCOUNTER — Ambulatory Visit: Payer: 59 | Admitting: Physical Therapy

## 2017-11-28 ENCOUNTER — Encounter: Payer: Self-pay | Admitting: Physical Therapy

## 2017-11-28 DIAGNOSIS — R279 Unspecified lack of coordination: Secondary | ICD-10-CM | POA: Diagnosis not present

## 2017-11-28 DIAGNOSIS — M6281 Muscle weakness (generalized): Secondary | ICD-10-CM

## 2017-11-28 NOTE — Therapy (Signed)
[facility] [address], [address] [place], [place], [address] Phone: [phone]   Fax:  [phone]  Physical Therapy Treatment  Patient Details  Name: Ann Roberts MRN: [id number] Date of Birth: 1980/07/24 Referring Provider: Dr. Dois Davenport Rivard   Encounter Date: 11/28/2017  PT End of Session - 11/28/17 0852    Visit Number  4    Date for PT Re-Evaluation  02/27/18    Authorization Type  UMR    PT Start Time  0845    PT Stop Time  0925    PT Time Calculation (min)  40 min    Activity Tolerance  Patient tolerated treatment well    Behavior During Therapy  [facility] for tasks assessed/performed       Past Medical History:  Diagnosis Date  . AMA (advanced maternal age) multigravida 35+   . H/O toxoplasmosis    pt. denies  . H/O varicella   . History of cystitis   . Hx: UTI (urinary tract infection)    x 1  . Hx: UTI (urinary tract infection)   . Infertility, female   . No pertinent past medical history   . Status post epidural steroid injection    ruptured disc L5 and S1    Past Surgical History:  Procedure Laterality Date  . ANTERIOR CRUCIATE LIGAMENT REPAIR  1996   R  . epidural steroids  2007   x2 for ruptured disc in back  . TONSILLECTOMY  2002  . WISDOM TOOTH EXTRACTION      There were no vitals filed for this visit.  Subjective Assessment - 11/28/17 0846    Subjective  I have not had leakage since last visit.     Patient Stated Goals  how to improve urinary leakage with coughing, sneezing, jumping    Currently in Pain?  Yes    Pain Score  3     Pain Location  Back    Pain Orientation  Right    Pain Descriptors / Indicators  Aching;Dull    Pain Type  Acute pain    Pain Onset  More than a month ago    Pain Frequency  Intermittent    Aggravating Factors   When upright feels unsteady    Pain Relieving Factors  stretching, improves throgh the day    Multiple Pain Sites  No                        [facility] Adult PT Treatment/Exercise - 11/28/17 0001      Therapeutic Activites    Therapeutic Activities  Other Therapeutic Activities    Other Therapeutic Activities  posture re-education in front of mirror to elongate the spina and reduce anterior pelvic tilt      Lumbar Exercises: Aerobic   Elliptical  level 2 3 min forward, 3 min backward      Lumbar Exercises: Standing   Other Standing Lumbar Exercises  jump on trampoline with pelvic and abdominal bracing contraction      Lumbar Exercises: Supine   Ab Set  15 reps;Limitations    AB Set Limitations  needed tactile cues to contract the left abdominals and draw up the lower abdominals      Manual Therapy   Manual Therapy  Soft tissue mobilization;Myofascial release    Soft tissue mobilization  lumbar paraspinals with assistive device, soft tissue work to bilatera quadratus, transverse abdominus, and around the umbilicus    Myofascial Release  along the lateral abdominal fascia ; anterior abdominal fascia with pulling anteriorly in quadruped; release of the transverse abdominus               PT Short Term Goals - 11/19/17 0934      PT SHORT TERM GOAL #1   Title  independent with initial HEP    Time  4    Period  Weeks    Status  Achieved      PT SHORT TERM GOAL #3   Title  urninary leakage decreased by 25% with coughing, laughing    Time  4    Period  Weeks    Status  Achieved        PT Long Term Goals - 11/28/17 [id number]      PT LONG TERM GOAL #3   Title  able to fully evacuate her bowels due to improved strength in the pelvic floor muscles    Baseline  80% better and could be diet    Time  4    Period  Weeks    Status  On-going      PT LONG TERM GOAL #4   Title  urinary leakage with jumping decreased >/= 70% due to improved pelvic floor and abdominal strength    Time  4    Period  Months    Status  On-going            Plan - 11/28/17 0855    Clinical Impression  Statement  Patient has not had urinary leakage since last visit. Patient still needs tactile cues to contract the lower abdominals. After manual work she was able to contract better but the left side is weaker than the right. Patient is understanding correct posture when in the mirror and contracting the lower abdominals to reduce the anterior pelvic tilt.  Patient is having back pain which is decreased when she braces her abdominals.  Patient will benefit from skilled therapy to improve contractions and coordination.     Rehab Potential  Excellent    Clinical Impairments Affecting Rehab Potential  none    PT Frequency  1x / week    PT Duration  Other (comment) 4 months    PT Treatment/Interventions  Biofeedback;Therapeutic activities;Therapeutic exercise;Patient/family education;Neuromuscular re-education;Manual techniques    PT Next Visit Plan  core strength with pelvic floor EMG; soft tissue work internally    Recommended Other Services  MD signed initial eval    Consulted and Agree with Plan of Care  Patient       Patient will benefit from skilled therapeutic intervention in order to improve the following deficits and impairments:  Increased muscle spasms, Decreased strength, Decreased activity tolerance  Visit Diagnosis: Muscle weakness (generalized)  Unspecified lack of coordination     Problem List Patient Active Problem List   Diagnosis Date Noted  . Vaginal delivery 12/09/2016  . Neonatal death in prior pregnancy, currently pregnant   . 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 Maternal age 39+, multigravida, antepartum   . Hx of neonatal death in prior pregnancy--occurred 2 days after delivery 03/06/2012    Eulis FosterCheryl Gray, PT 11/28/17 11:18 AM   [facility] [address], [address] [place], [place], [address] Phone: [phone]   Fax:  [phone]  Name: Ann BassetJennifer D Roberts MRN:  [id number] Date of Birth: 1981/05/13

## 2017-12-03 ENCOUNTER — Ambulatory Visit: Payer: 59 | Admitting: Physical Therapy

## 2017-12-03 ENCOUNTER — Encounter: Payer: Self-pay | Admitting: Physical Therapy

## 2017-12-03 DIAGNOSIS — R279 Unspecified lack of coordination: Secondary | ICD-10-CM

## 2017-12-03 DIAGNOSIS — M6281 Muscle weakness (generalized): Secondary | ICD-10-CM | POA: Diagnosis not present

## 2017-12-03 NOTE — Therapy (Signed)
[facility] [address], [address] [place], [place], [address] Phone: [phone]   Fax:  [phone]  Physical Therapy Treatment  Patient Details  Name: Ann Roberts MRN: [id number] Date of Birth: 12-29-1980 Referring Provider: Dr. Dois Davenport Rivard   Encounter Date: 12/03/2017  PT End of Session - 12/03/17 0841    Visit Number  5    Date for PT Re-Evaluation  02/27/18    Authorization Type  UMR    PT Start Time  0800    PT Stop Time  0840    PT Time Calculation (min)  40 min    Activity Tolerance  Patient tolerated treatment well    Behavior During Therapy  [facility] for tasks assessed/performed       Past Medical History:  Diagnosis Date  . AMA (advanced maternal age) multigravida 35+   . H/O toxoplasmosis    pt. denies  . H/O varicella   . History of cystitis   . Hx: UTI (urinary tract infection)    x 1  . Hx: UTI (urinary tract infection)   . Infertility, female   . No pertinent past medical history   . Status post epidural steroid injection    ruptured disc L5 and S1    Past Surgical History:  Procedure Laterality Date  . ANTERIOR CRUCIATE LIGAMENT REPAIR  1996   R  . epidural steroids  2007   x2 for ruptured disc in back  . TONSILLECTOMY  2002  . WISDOM TOOTH EXTRACTION      There were no vitals filed for this visit.  Subjective Assessment - 12/03/17 0805    Subjective  I was able to sneeze and brace with a slight leakage. I have not been able to exercise because I have a summer cold. I can engage the abdominal muscles better.     Patient Stated Goals  how to improve urinary leakage with coughing, sneezing, jumping    Currently in Pain?  Yes    Pain Score  3     Pain Location  Back    Pain Orientation  Right    Pain Descriptors / Indicators  Aching;Dull    Pain Type  Acute pain    Pain Onset  More than a month ago    Pain Frequency  Intermittent    Aggravating Factors   when upright  feels unsteady     Pain Relieving Factors  stretching, improves through the day    Multiple Pain Sites  No                       [facility] Adult PT Treatment/Exercise - 12/03/17 0001      Lumbar Exercises: Aerobic   Elliptical  level 2 3 min forward, 3 min backward      Lumbar Exercises: Standing   Other Standing Lumbar Exercises  jump on trampoline with ball squeeze and engaging the pelvic floor and bracing the abdominals then contract without the ball      Lumbar Exercises: Supine   Bridge with March  10 reps;1 second alternate legs with core strength    Other Supine Lumbar Exercises  lumges 10x each way with VC on not to drop hip and not letting the knee go past the foot    Other Supine Lumbar Exercises  supine with full curl up to sitting with tactlle cues to engaage the abdominals modified boat pose; curl up 15 x with abdominal engagement; v shape  and alternat ehand to knee      Lumbar Exercises: Prone   Other Prone Lumbar Exercises  PLANK on hands hold 15 sec with tactile cues to engage the abdominals             PT Education - 12/03/17 0840    Education Details  reviewed her abdominal workout she does with videos and changed ways to perform the exercise    Person(s) Educated  Patient    Methods  Explanation;Demonstration;Verbal cues    Comprehension  Verbalized understanding;Returned demonstration       PT Short Term Goals - 11/19/17 0934      PT SHORT TERM GOAL #1   Title  independent with initial HEP    Time  4    Period  Weeks    Status  Achieved      PT SHORT TERM GOAL #3   Title  urninary leakage decreased by 25% with coughing, laughing    Time  4    Period  Weeks    Status  Achieved        PT Long Term Goals - 11/28/17 [id number]      PT LONG TERM GOAL #3   Title  able to fully evacuate her bowels due to improved strength in the pelvic floor muscles    Baseline  80% better and could be diet    Time  4    Period  Weeks    Status  On-going      PT LONG  TERM GOAL #4   Title  urinary leakage with jumping decreased >/= 70% due to improved pelvic floor and abdominal strength    Time  4    Period  Months    Status  On-going            Plan - 12/03/17 0841    Clinical Impression Statement  Patient is understanding how to engage her core and breath correctly to contract her abdominals during exericse.  Patient has to modify her exercises she does with the videos to lower level due to her core strenght is weak.  Patient is understanding how to correct her posture to decrease stress on lumbar. Patient had decreased leakage with sneezing but still has to remind her self to engage the pelvic floor.  Patient will benefit from skilled therapy to improve contractions and coordination.     Rehab Potential  Excellent    Clinical Impairments Affecting Rehab Potential  none    PT Frequency  1x / week    PT Duration  Other (comment) 4 months    PT Treatment/Interventions  Biofeedback;Therapeutic activities;Therapeutic exercise;Patient/family education;Neuromuscular re-education;Manual techniques    PT Next Visit Plan  core strength with pelvic floor EMG; soft tissue work internally    Consulted and Agree with Plan of Care  Patient       Patient will benefit from skilled therapeutic intervention in order to improve the following deficits and impairments:  Increased muscle spasms, Decreased strength, Decreased activity tolerance  Visit Diagnosis: Muscle weakness (generalized)  Unspecified lack of coordination     Problem List Patient Active Problem List   Diagnosis Date Noted  . Vaginal delivery 12/09/2016  . Neonatal death in prior pregnancy, currently pregnant   . 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 Maternal age 735+, multigravida, antepartum   . Hx of neonatal death in prior pregnancy--occurred 2 days after delivery 03/06/2012    Eulis Fosterheryl Lalia Loudon, PT  12/03/17 8:45 AM   [facility] [address], [address] [place], [place], [address] Phone: [phone]   Fax:  [phone]  Name: Ann Roberts MRN: [id number] Date of Birth: [birth year]

## 2017-12-17 ENCOUNTER — Ambulatory Visit: Payer: 59 | Admitting: Physical Therapy

## 2017-12-17 ENCOUNTER — Encounter: Payer: Self-pay | Admitting: Physical Therapy

## 2017-12-17 DIAGNOSIS — M6281 Muscle weakness (generalized): Secondary | ICD-10-CM | POA: Diagnosis not present

## 2017-12-17 DIAGNOSIS — R279 Unspecified lack of coordination: Secondary | ICD-10-CM

## 2017-12-17 NOTE — Patient Instructions (Addendum)
Slow Contraction: Gravity Eliminated (Hook-Lying)    Lie with hips and knees bent. Slowly squeeze pelvic floor for _10__ seconds. Rest for _5__ seconds. Repeat _5 times. Do _2__ times a day.   Copyright  VHI. All rights reserved.   Slow Contraction: Gravity Resisted (Sitting)    Sitting, slowly squeeze pelvic floor for __5_ seconds. Rest for _5__ seconds. Repeat _5__ times. Do __2_ times a day.  Copyright  VHI. All rights reserved.  Slow Contraction: Gravity Resisted (Sitting)    Sitting, slowly squeeze pelvic floor for ___ seconds. Rest for ___ seconds. Repeat ___ times. Do ___ times a day.  Copyright  VHI. All rights reserved.  Slow Contraction: Gravity Resisted (Standing)    Standing, slowly squeeze pelvic floor for __5_ seconds. Rest for _5__ seconds. Repeat _5__ times. Do _2__ times a day.  Copyright  VHI. All rights reserved.  [facility] [address], [address] [place], [place] [address] Phone # [phone] Fax [phone]

## 2017-12-17 NOTE — Therapy (Signed)
[facility] [address], [address] [place], [place], [address] Phone: [phone]   Fax:  [phone]  Physical Therapy Treatment  Patient Details  Name: Ann BassetJennifer D Roberts MRN: [id number] Date of Birth: 1980/09/15 Referring Provider: Dr. Dois DavenportSandra Rivard   Encounter Date: 12/17/2017  PT End of Session - 12/17/17 0841    Visit Number  6    Date for PT Re-Evaluation  02/27/18    Authorization Type  UMR    PT Start Time  0800    PT Stop Time  0841    PT Time Calculation (min)  41 min    Activity Tolerance  Patient tolerated treatment well    Behavior During Therapy  [facility] for tasks assessed/performed       Past Medical History:  Diagnosis Date  . AMA (advanced maternal age) multigravida 35+   . H/O toxoplasmosis    pt. denies  . H/O varicella   . History of cystitis   . Hx: UTI (urinary tract infection)    x 1  . Hx: UTI (urinary tract infection)   . Infertility, female   . No pertinent past medical history   . Status post epidural steroid injection    ruptured disc L5 and S1    Past Surgical History:  Procedure Laterality Date  . ANTERIOR CRUCIATE LIGAMENT REPAIR  1996   R  . epidural steroids  2007   x2 for ruptured disc in back  . TONSILLECTOMY  2002  . WISDOM TOOTH EXTRACTION      There were no vitals filed for this visit.  Subjective Assessment - 12/17/17 0804    Subjective  I have not been as good with my exercises. No leakage.     Patient Stated Goals  how to improve urinary leakage with coughing, sneezing, jumping    Currently in Pain?  No/denies    Multiple Pain Sites  No                    Pelvic Floor Special Questions - 12/17/17 0001    Biofeedback  resting tone in supine is 1 uv; Quick flicks in supine is 10 uv, contract 10 sec. around 8 uv; , sitting contract 5 sec 6-8uv; standing hold 5 sec with 6 uv        [facility] Adult PT Treatment/Exercise - 12/17/17 0001      Lumbar  Exercises: Aerobic   Elliptical  level 2 3 min forward, 3 min backward             PT Education - 12/17/17 0839    Education Details  pelvic floor contraction for supine, sitting, standing    Person(s) Educated  Patient    Methods  Explanation;Demonstration;Verbal cues;Handout    Comprehension  Returned demonstration;Verbalized understanding       PT Short Term Goals - 11/19/17 0934      PT SHORT TERM GOAL #1   Title  independent with initial HEP    Time  4    Period  Weeks    Status  Achieved      PT SHORT TERM GOAL #3   Title  urninary leakage decreased by 25% with coughing, laughing    Time  4    Period  Weeks    Status  Achieved        PT Long Term Goals - 12/17/17 0845      PT LONG TERM GOAL #1   Title  She will  be independent with all HEP    Baseline  still learning    Time  4    Period  Weeks    Status  On-going      PT LONG TERM GOAL #2   Title  urniary leakage with coughing and laughing decreased >/ 80%    Baseline  50%     Time  4    Period  Months    Status  On-going      PT LONG TERM GOAL #3   Title  able to fully evacuate her bowels due to improved strength in the pelvic floor muscles    Baseline  80% better and could be diet    Time  4    Period  Weeks    Status  On-going      PT LONG TERM GOAL #4   Title  urinary leakage with jumping decreased >/= 70% due to improved pelvic floor and abdominal strength    Time  4    Period  Months    Status  On-going            Plan - 12/17/17 0806    Clinical Impression Statement  Patient is able to return to resting level of 1-2 uv in supine, sitting and standing. Patient is able to contract to 8uv in hookly, sitting and standing 6 uv.  Patient needs to have tactile cues to the lower adominals to fully contract the pelvic floor due to lower abdominal weakness.  Patient is able to contract 10 seconds in hookly, 5 seconds for sitting and standing.  Patient has not had leakage since last visit.  Patient will benefit from skilled therapy to imporve contractions and coordination.     Rehab Potential  Excellent    Clinical Impairments Affecting Rehab Potential  none    PT Frequency  1x / week    PT Duration  Other (comment) 4 months    PT Treatment/Interventions  Biofeedback;Therapeutic activities;Therapeutic exercise;Patient/family education;Neuromuscular re-education;Manual techniques    PT Next Visit Plan  core strength with pelvic floor EMG; soft tissue work internally, see patient in 1 month    PT Home Exercise Plan  ACEESS CODE: [email or web address]    Consulted and Agree with Plan of Care  Patient       Patient will benefit from skilled therapeutic intervention in order to improve the following deficits and impairments:  Increased muscle spasms, Decreased strength, Decreased activity tolerance  Visit Diagnosis: Muscle weakness (generalized)  Unspecified lack of coordination     Problem List Patient Active Problem List   Diagnosis Date Noted  . Vaginal delivery 2017-01-06  . Neonatal death in prior pregnancy, currently pregnant   . 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 Maternal age 53+, multigravida, antepartum   . Hx of neonatal death in prior pregnancy--occurred 2 days after delivery 03/06/2012    Eulis Foster, PT 12/17/17 8:46 AM   [facility] [address], [address] [place], [place], [address] Phone: [phone]   Fax:  [phone]  Name: Ann Roberts MRN: [id number] Date of Birth: 02-05-1981

## 2018-01-13 ENCOUNTER — Ambulatory Visit: Payer: 59 | Attending: Obstetrics and Gynecology | Admitting: Physical Therapy

## 2018-01-13 ENCOUNTER — Encounter: Payer: Self-pay | Admitting: Physical Therapy

## 2018-01-13 DIAGNOSIS — R279 Unspecified lack of coordination: Secondary | ICD-10-CM | POA: Diagnosis not present

## 2018-01-13 DIAGNOSIS — M6281 Muscle weakness (generalized): Secondary | ICD-10-CM | POA: Insufficient documentation

## 2018-01-13 NOTE — Patient Instructions (Addendum)
Elevator (Sitting)    Sitting, imagine pelvic floor with 2 levels. "Going up", contract pelvic floor and Hold for _3__ seconds on each level. Then "going down", contract pelvic floor and Hold for ___ seconds on each level. Relax. Repeat __5_ times. Do _1__ times a day.  Copyright  VHI. All rights reserved.  Combination: Slow Hold With Quick Flick (Sitting)    Sitting, tighten pelvic floor and Hold for __4_ seconds. Without releasing contraction, do __3_ quick flicks. Relax. Repeat _10__ times. Do __1_ times a day.  Copyright  VHI. All rights reserved.  [facility] [address], [address] [place], [place] [address] Phone # [phone] Fax [phone]   Access Code: [email or web address]  URL: [email or web address]  Date: 01/13/2018  Prepared by: Eulis Fosterheryl Gray   Exercises  Quadruped Pelvic Floor Contraction with Opposite Arm and Leg Lift - 10 reps - 1 sets - 5 hold - 1x daily - 7x weekly  Seated Pelvic Floor Contraction - 10 reps - 1 sets - 5 hold - 3x daily - 7x weekly  Bent Knee Fallouts with Alternating Legs - 10 reps - 1 sets - 1x daily - 7x weekly  Supine March - 10 reps - 1 sets - 1x daily - 7x weekly

## 2018-01-13 NOTE — Therapy (Signed)
[facility] [address] [place], [place], [address] Phone: [phone]   Fax:  [phone]  Physical Therapy Treatment  Patient Details  Name: RUIE SENDEJO MRN: [id number] Date of Birth: 03-18-1981 Referring Provider: Dr. Dois Davenport Rivard   Encounter Date: 01/13/2018  PT End of Session - 01/13/18 0807    Visit Number  7    Date for PT Re-Evaluation  02/27/18    Authorization Type  UMR    PT Start Time  0800    PT Stop Time  0845    PT Time Calculation (min)  45 min    Activity Tolerance  Patient tolerated treatment well    Behavior During Therapy  [facility] for tasks assessed/performed       Past Medical History:  Diagnosis Date  . AMA (advanced maternal age) multigravida 35+   . H/O toxoplasmosis    pt. denies  . H/O varicella   . History of cystitis   . Hx: UTI (urinary tract infection)    x 1  . Hx: UTI (urinary tract infection)   . Infertility, female   . No pertinent past medical history   . Status post epidural steroid injection    ruptured disc L5 and S1    Past Surgical History:  Procedure Laterality Date  . ANTERIOR CRUCIATE LIGAMENT REPAIR  1996   R  . epidural steroids  2007   x2 for ruptured disc in back  . TONSILLECTOMY  2002  . WISDOM TOOTH EXTRACTION      There were no vitals filed for this visit.  Subjective Assessment - 01/13/18 0803    Subjective  I have to brace myself with big sneezes.  I have not had leakage since last visit.     Patient Stated Goals  how to improve urinary leakage with coughing, sneezing, jumping    Currently in Pain?  No/denies    Multiple Pain Sites  No                    Pelvic Floor Special Questions - 01/13/18 0001    Biofeedback  sitting 10 second  at 20 uv; quick flicks are 30 uv;     Biofeedback sensor type  Surface   vaginal   Biofeedback Activity  Other   steps; abdominal exercise       [facility] Adult PT Treatment/Exercise -  01/13/18 0001      Lumbar Exercises: Aerobic   Elliptical  level 3 3 min forward, 3 min backward      Lumbar Exercises: Supine   Clam  20 reps;1 second   connected to pelvic floor EMG   Bent Knee Raise  10 reps;1 second    Bent Knee Raise Limitations  connected to pelvic floor EMG             PT Education - 01/13/18 0829    Education Details  pelvic floor coordination contraction; Access Code: [email or web address]     Person(s) Educated  Patient    Methods  Explanation;Demonstration;Verbal cues;Handout    Comprehension  Returned demonstration;Verbalized understanding       PT Short Term Goals - 11/19/17 0934      PT SHORT TERM GOAL #1   Title  independent with initial HEP    Time  4    Period  Weeks    Status  Achieved      PT SHORT TERM GOAL #3   Title  urninary leakage decreased by  25% with coughing, laughing    Time  4    Period  Weeks    Status  Achieved        PT Long Term Goals - 01/13/18 0850      PT LONG TERM GOAL #2   Title  urniary leakage with coughing and laughing decreased >/ 80%    Baseline  70%    Time  4    Period  Months    Status  On-going      PT LONG TERM GOAL #3   Title  able to fully evacuate her bowels due to improved strength in the pelvic floor muscles    Baseline  80% better and could be diet    Time  4    Period  Weeks    Status  On-going      PT LONG TERM GOAL #4   Title  urinary leakage with jumping decreased >/= 70% due to improved pelvic floor and abdominal strength    Time  4    Period  Months    Status  On-going            Plan - 01/13/18 [id number]    Clinical Impression Statement  Patient is able to contract in sitting to 20 uv and quick flicks to 30 uv.  Patient is able to show control with quick flicks and hold in sitting. Patient is able to contract the pelvic floor and breath. Patient has not leaked urin but has to contract before she sneezes.  Patient will benefit from skilled therapy to imporve pelvic floor strength and  coordination.     Clinical Decision Making  High    Rehab Potential  Excellent    Clinical Impairments Affecting Rehab Potential  none    PT Frequency  1x / week    PT Duration  Other (comment)   4 months   PT Treatment/Interventions  Biofeedback;Therapeutic activities;Therapeutic exercise;Patient/family education;Neuromuscular re-education;Manual techniques    PT Next Visit Plan  pelvic floor EMG in standing; progress lower abdominals, see patient in 1 month    PT Home Exercise Plan  ACEESS CODE: [email or web address]    Consulted and Agree with Plan of Care  Patient       Patient will benefit from skilled therapeutic intervention in order to improve the following deficits and impairments:  Increased muscle spasms, Decreased strength, Decreased activity tolerance  Visit Diagnosis: Muscle weakness (generalized)  Unspecified lack of coordination     Problem List Patient Active Problem List   Diagnosis Date Noted  . Vaginal delivery 12/09/2016  . Neonatal death in prior pregnancy, currently pregnant   . 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 Maternal age 39+, multigravida, antepartum   . Hx of neonatal death in prior pregnancy--occurred 2 days after delivery 03/06/2012    Eulis FosterCheryl Davi Rotan, PT 01/13/18 8:51 AM ' [facility] [address] [place], [place], [address] Phone: [phone]   Fax:  [phone]  Name: Ilda BassetJennifer D Loken MRN: [id number] Date of Birth: Apr 01, 1981

## 2018-02-11 ENCOUNTER — Ambulatory Visit: Payer: 59 | Admitting: Physical Therapy

## 2018-02-18 ENCOUNTER — Ambulatory Visit: Payer: 59 | Attending: Obstetrics and Gynecology | Admitting: Physical Therapy

## 2018-02-18 DIAGNOSIS — M6281 Muscle weakness (generalized): Secondary | ICD-10-CM | POA: Diagnosis not present

## 2018-02-18 DIAGNOSIS — R279 Unspecified lack of coordination: Secondary | ICD-10-CM | POA: Insufficient documentation

## 2018-02-18 NOTE — Patient Instructions (Signed)
Elevator (Sitting)    Sitting, imagine pelvic floor with 2 levels. "Going up", contract pelvic floor and Hold for _3__ seconds on each level. Then "going down", contract pelvic floor and Hold for ___ seconds on each level. Relax. Repeat __5_ times. Do _1__ times a day.  Copyright  VHI. All rights reserved.  Combination: Slow Hold With Quick Flick (Sitting)    Sitting, tighten pelvic floor and Hold for __4_ seconds. Without releasing contraction, do __3_ quick flicks. Relax. Repeat _10__ times. Do __1_ times a day. Work towards holding 20 seconds Slow Contraction: Gravity Resisted (Standing)    Standing, slowly squeeze pelvic floor for __5_ seconds. Rest for _5__ seconds. Repeat _5__ times. Do _2__ times a day. Work towards 20 second hold.  Access Code: [email or web address]  URL: [email or web address]  Date: 01/13/2018  Prepared by: Eulis Foster   Exercises   Quadruped Pelvic Floor Contraction with Opposite Arm and Leg Lift - 10 reps - 1 sets - 5 hold - 1x daily - 7x weekly   Seated Pelvic Floor Contraction - 10 reps - 1 sets - 5 hold - 3x daily - 7x weekly   Bent Knee Fallouts with Alternating Legs - 10 reps - 1 sets - 1x daily - 7x weekly   Supine March - 10 reps - 1 sets - 1x daily - 7x weekly  Counter top push ups Lunges Squats Stand and move one leg out to the side, backwards,   [facility] [address] [place], [place] [address] Phone # [phone] Fax [phone]

## 2018-02-18 NOTE — Therapy (Signed)
[facility] [address] [place], [place], [address] Phone: [phone]   Fax:  [phone]  Physical Therapy Treatment  Patient Details  Name: Ann Roberts MRN: [id number] Date of Birth: 1981/02/13 Referring Provider (PT): Dr. Katharine Look Rivard   Encounter Date: 02/18/2018  PT End of Session - 02/18/18 0804    Visit Number  8    Date for PT Re-Evaluation  02/27/18    Authorization Type  UMR    PT Start Time  0800    PT Stop Time  0840    PT Time Calculation (min)  40 min    Activity Tolerance  Patient tolerated treatment well    Behavior During Therapy  [facility] for tasks assessed/performed       Past Medical History:  Diagnosis Date  . AMA (advanced maternal age) multigravida 39+   . H/O toxoplasmosis    pt. denies  . H/O varicella   . History of cystitis   . Hx: UTI (urinary tract infection)    x 1  . Hx: UTI (urinary tract infection)   . Infertility, female   . No pertinent past medical history   . Status post epidural steroid injection    ruptured disc L5 and S1    Past Surgical History:  Procedure Laterality Date  . ANTERIOR CRUCIATE LIGAMENT REPAIR  1996   R  . epidural steroids  2007   x2 for ruptured disc in back  . TONSILLECTOMY  2002  . WISDOM TOOTH EXTRACTION      There were no vitals filed for this visit.  Subjective Assessment - 02/18/18 0804    Subjective  I feel like I am stronger donw there.  I have not been able to be consistent of my exercises.  I have been doing weight watchers and lost weight. I am not leaking urine.  I sneezed the other day and not having to hold the urine.     Patient Stated Goals  how to improve urinary leakage with coughing, sneezing, jumping    Currently in Pain?  No/denies    Multiple Pain Sites  No         [facility] PT Assessment - 02/18/18 0001      Assessment   Medical Diagnosis  M62.9 Disorder of muscle, unspecified    Referring Provider (PT)  Dr.  Katharine Look Rivard    Onset Date/Surgical Date  07/19/11    Prior Therapy  None      Precautions   Precautions  None      Restrictions   Weight Bearing Restrictions  No      Home Environment   Living Environment  Private residence      Prior Function   Level of Independence  Independent    Vocation  Part time employment    Licensed conveyancer    Leisure  exercise      Cognition   Overall Cognitive Status  Within Functional Limits for tasks assessed      Posture/Postural Control   Posture/Postural Control  No significant limitations    Posture Comments  holds children on left hip      AROM   Overall AROM   Within functional limits for tasks performed      PROM   Overall PROM   Within functional limits for tasks performed      Strength   Overall Strength Comments  abdominal strength is 2/5    Right Hip External Rotation  5/5    Right Hip ADduction  5/5    Left Hip External Rotation  5/5      Palpation   SI assessment   ASIS are equal      Special Tests    Special Tests  Hip Special Tests      Trendelenburg Test   Findings  Negative    Side  Left    Comments  no drop                Pelvic Floor Special Questions - 02/18/18 0001    Strength  fair squeeze, definite lift        [facility] Adult PT Treatment/Exercise - 02/18/18 0001      Exercises   Exercises  Other Exercises    Other Exercises   educated patient on exercise program that incorporates her day including lunges, squats, exercises with stroller, single leg stance exercises, how to exercise as she plays games on the floor with her children, how to exericse as she is making a meal or preparing her kids lunches and how to exercise as she is going up and down steps.       Lumbar Exercises: Aerobic   Elliptical  level 3 4 min forward, 4 min backward             PT Education - 02/18/18 0851    Education Details  education on how to progress her HEP, how to incorporate it into her  day, how to workout with a stroller, important pelvic floor exercise to do during the day    Person(s) Educated  Patient    Methods  Explanation;Demonstration;Verbal cues;Handout    Comprehension  Returned demonstration;Verbalized understanding       PT Short Term Goals - 02/18/18 0857      PT SHORT TERM GOAL #1   Title  independent with initial HEP    Time  4    Period  Weeks    Status  Achieved      PT SHORT TERM GOAL #2   Title  finish assessment of pelvic floor muscle strength    Time  4    Period  Weeks    Status  Achieved      PT SHORT TERM GOAL #3   Title  urninary leakage decreased by 25% with coughing, laughing    Time  4    Period  Weeks    Status  Achieved        PT Long Term Goals - 02/18/18 0857      PT LONG TERM GOAL #1   Title  She will be independent with all HEP    Time  4    Period  Weeks    Status  Achieved      PT LONG TERM GOAL #2   Title  urniary leakage with coughing and laughing decreased >/ 80%    Time  4    Period  Months    Status  Achieved      PT LONG TERM GOAL #3   Title  able to fully evacuate her bowels due to improved strength in the pelvic floor muscles    Time  4    Period  Weeks    Status  Achieved      PT LONG TERM GOAL #4   Title  urinary leakage with jumping decreased >/= 70% due to improved pelvic floor and abdominal strength    Time  4    Period  Months    Status  Achieved            Plan - 02/18/18 0818    Clinical Impression Statement  Patient reports no issues with urinary leakage.  She is able to evacuate her bowels without difficulty. Patient has full strength of her hips.  Patient has increased pelvic floor strength.  Patient is having difficulty with incorporating her exercises into her day with work and family.  Patient was educated on the most important pelvic floor exercises to do and how to incorporate her exercises into her day sporadically. Patient is indpendent with her present HEP and ready for  discharge.     Rehab Potential  Excellent    Clinical Impairments Affecting Rehab Potential  none    PT Frequency  1x / week    PT Treatment/Interventions  Biofeedback;Therapeutic activities;Therapeutic exercise;Patient/family education;Neuromuscular re-education;Manual techniques    PT Next Visit Plan  Disharge to HEP    PT Home Exercise Plan  ACEESS CODE: [email or web address]    Consulted and Agree with Plan of Care  Patient       Patient will benefit from skilled therapeutic intervention in order to improve the following deficits and impairments:  Increased muscle spasms, Decreased strength, Decreased activity tolerance  Visit Diagnosis: Muscle weakness (generalized)  Unspecified lack of coordination     Problem List Patient Active Problem List   Diagnosis Date Noted  . Vaginal delivery Dec 25, 2016  . Neonatal death in prior pregnancy, currently pregnant   . 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 Maternal age 72+, multigravida, antepartum   . Hx of neonatal death in prior pregnancy--occurred 2 days after delivery 03/06/2012    Earlie Counts, PT 02/18/18 9:02 AM   [facility] [address], [place], [place], [address] Phone: [phone]   Fax:  [phone]  Name: ISSABELLE MCRANEY MRN: [id number] Date of Birth: 1981/03/07  PHYSICAL THERAPY DISCHARGE SUMMARY  Visits from Start of Care: 8  Current functional level related to goals / functional outcomes: See above.    Remaining deficits: See above.    Education / Equipment: HEP Plan: Patient agrees to discharge.  Patient goals were met. Patient is being discharged due to meeting the stated rehab goals.  Thank you for the referral. cervical hyperextension sprain ?????

## 2018-04-21 MED FILL — ESCITALOPRAM 10 MG TABLET: 10 | 90 days supply | Qty: 90 | Fill #1

## 2018-05-29 DIAGNOSIS — H5213 Myopia, bilateral: Secondary | ICD-10-CM | POA: Diagnosis not present

## 2018-06-04 DIAGNOSIS — E282 Polycystic ovarian syndrome: Secondary | ICD-10-CM | POA: Diagnosis not present

## 2018-06-04 DIAGNOSIS — Z6824 Body mass index (BMI) 24.0-24.9, adult: Secondary | ICD-10-CM | POA: Diagnosis not present

## 2018-06-04 DIAGNOSIS — Z3040 Encounter for surveillance of contraceptives, unspecified: Secondary | ICD-10-CM | POA: Diagnosis not present

## 2018-06-04 DIAGNOSIS — Z01419 Encounter for gynecological examination (general) (routine) without abnormal findings: Secondary | ICD-10-CM | POA: Diagnosis not present

## 2018-06-04 MED FILL — PROGESTERONE MICRONIZED 200: 200 | 12 days supply | Qty: 12 | Fill #0

## 2018-07-21 MED FILL — ESCITALOPRAM 10 MG TABLET: 10 | 90 days supply | Qty: 90 | Fill #0

## 2018-07-21 MED FILL — PROGESTERONE MICRONIZED 200: 200 | 12 days supply | Qty: 12 | Fill #1 | Status: TO

## 2018-08-18 MED FILL — PROGESTERONE 200 MG CAPSULE: 200 | 12 days supply | Qty: 12 | Fill #0

## 2018-08-30 MED FILL — PROGESTERONE 200 MG CAPSULE: 200 | 12 days supply | Qty: 12 | Fill #1

## 2018-09-05 MED FILL — PROGESTERONE 200 MG CAPSULE: 200 | 12 days supply | Qty: 12 | Fill #2

## 2019-01-19 MED FILL — PROGESTERONE MICRONIZED 200: 200 | 12 days supply | Qty: 12 | Fill #0

## 2019-07-27 DIAGNOSIS — Z01419 Encounter for gynecological examination (general) (routine) without abnormal findings: Secondary | ICD-10-CM | POA: Diagnosis not present

## 2019-07-27 DIAGNOSIS — Z3040 Encounter for surveillance of contraceptives, unspecified: Secondary | ICD-10-CM | POA: Diagnosis not present

## 2019-07-27 DIAGNOSIS — Z1211 Encounter for screening for malignant neoplasm of colon: Secondary | ICD-10-CM | POA: Diagnosis not present

## 2019-07-27 DIAGNOSIS — N939 Abnormal uterine and vaginal bleeding, unspecified: Secondary | ICD-10-CM | POA: Diagnosis not present

## 2019-07-27 DIAGNOSIS — Z1231 Encounter for screening mammogram for malignant neoplasm of breast: Secondary | ICD-10-CM | POA: Diagnosis not present

## 2019-07-27 DIAGNOSIS — Z6824 Body mass index (BMI) 24.0-24.9, adult: Secondary | ICD-10-CM | POA: Diagnosis not present

## 2019-07-27 DIAGNOSIS — Z1239 Encounter for other screening for malignant neoplasm of breast: Secondary | ICD-10-CM | POA: Diagnosis not present

## 2020-03-23 DIAGNOSIS — Z0000 Encounter for general adult medical examination without abnormal findings: Secondary | ICD-10-CM | POA: Diagnosis not present

## 2020-04-06 ENCOUNTER — Other Ambulatory Visit (HOSPITAL_COMMUNITY): Payer: Self-pay | Admitting: Gastroenterology

## 2020-04-06 DIAGNOSIS — R194 Change in bowel habit: Secondary | ICD-10-CM | POA: Diagnosis not present

## 2020-04-06 DIAGNOSIS — K5904 Chronic idiopathic constipation: Secondary | ICD-10-CM | POA: Diagnosis not present

## 2020-04-06 DIAGNOSIS — K625 Hemorrhage of anus and rectum: Secondary | ICD-10-CM | POA: Diagnosis not present

## 2020-04-06 DIAGNOSIS — Z1211 Encounter for screening for malignant neoplasm of colon: Secondary | ICD-10-CM | POA: Diagnosis not present

## 2020-04-06 DIAGNOSIS — K219 Gastro-esophageal reflux disease without esophagitis: Secondary | ICD-10-CM | POA: Diagnosis not present

## 2020-04-06 MED FILL — CLENPIQ 10-3.5-12 MG-GM -GM: 10-3.5-12 M | 2 days supply | Qty: 320 | Fill #0

## 2020-04-17 DIAGNOSIS — R194 Change in bowel habit: Secondary | ICD-10-CM | POA: Diagnosis not present

## 2020-04-17 DIAGNOSIS — Z1211 Encounter for screening for malignant neoplasm of colon: Secondary | ICD-10-CM | POA: Diagnosis not present

## 2020-04-17 DIAGNOSIS — K625 Hemorrhage of anus and rectum: Secondary | ICD-10-CM | POA: Diagnosis not present

## 2020-07-26 DIAGNOSIS — N939 Abnormal uterine and vaginal bleeding, unspecified: Secondary | ICD-10-CM | POA: Diagnosis not present

## 2020-07-26 DIAGNOSIS — Z3040 Encounter for surveillance of contraceptives, unspecified: Secondary | ICD-10-CM | POA: Diagnosis not present

## 2020-07-26 DIAGNOSIS — Z1239 Encounter for other screening for malignant neoplasm of breast: Secondary | ICD-10-CM | POA: Diagnosis not present

## 2020-07-26 DIAGNOSIS — Z01419 Encounter for gynecological examination (general) (routine) without abnormal findings: Secondary | ICD-10-CM | POA: Diagnosis not present

## 2020-07-26 DIAGNOSIS — Z1231 Encounter for screening mammogram for malignant neoplasm of breast: Secondary | ICD-10-CM | POA: Diagnosis not present

## 2020-07-26 DIAGNOSIS — Z6825 Body mass index (BMI) 25.0-25.9, adult: Secondary | ICD-10-CM | POA: Diagnosis not present

## 2020-08-07 ENCOUNTER — Other Ambulatory Visit (HOSPITAL_COMMUNITY): Payer: Self-pay | Admitting: Internal Medicine

## 2020-08-07 DIAGNOSIS — J329 Chronic sinusitis, unspecified: Secondary | ICD-10-CM | POA: Diagnosis not present

## 2020-08-07 DIAGNOSIS — B9789 Other viral agents as the cause of diseases classified elsewhere: Secondary | ICD-10-CM | POA: Diagnosis not present

## 2020-09-19 DIAGNOSIS — N939 Abnormal uterine and vaginal bleeding, unspecified: Secondary | ICD-10-CM | POA: Diagnosis not present

## 2020-10-18 ENCOUNTER — Other Ambulatory Visit (HOSPITAL_COMMUNITY): Payer: Self-pay

## 2020-10-18 MED ORDER — CARESTART COVID-19 HOME TEST VI KIT
PACK | 0 refills | Status: DC
  Filled 2020-10-18: qty 4, 4d supply, fill #0

## 2021-01-12 DIAGNOSIS — M545 Low back pain, unspecified: Secondary | ICD-10-CM | POA: Diagnosis not present

## 2021-01-12 DIAGNOSIS — J329 Chronic sinusitis, unspecified: Secondary | ICD-10-CM | POA: Diagnosis not present

## 2021-03-12 DIAGNOSIS — R0981 Nasal congestion: Secondary | ICD-10-CM | POA: Diagnosis not present

## 2021-04-18 DIAGNOSIS — J310 Chronic rhinitis: Secondary | ICD-10-CM | POA: Diagnosis not present

## 2021-04-18 DIAGNOSIS — J342 Deviated nasal septum: Secondary | ICD-10-CM | POA: Diagnosis not present

## 2021-04-18 DIAGNOSIS — J343 Hypertrophy of nasal turbinates: Secondary | ICD-10-CM | POA: Diagnosis not present

## 2021-05-02 DIAGNOSIS — J029 Acute pharyngitis, unspecified: Secondary | ICD-10-CM | POA: Diagnosis not present

## 2021-06-21 DIAGNOSIS — H5213 Myopia, bilateral: Secondary | ICD-10-CM | POA: Diagnosis not present

## 2021-06-21 DIAGNOSIS — H52223 Regular astigmatism, bilateral: Secondary | ICD-10-CM | POA: Diagnosis not present

## 2021-07-12 DIAGNOSIS — Z1231 Encounter for screening mammogram for malignant neoplasm of breast: Secondary | ICD-10-CM | POA: Diagnosis not present

## 2021-07-12 DIAGNOSIS — Z01419 Encounter for gynecological examination (general) (routine) without abnormal findings: Secondary | ICD-10-CM | POA: Diagnosis not present

## 2021-07-12 DIAGNOSIS — Z3040 Encounter for surveillance of contraceptives, unspecified: Secondary | ICD-10-CM | POA: Diagnosis not present

## 2021-07-12 DIAGNOSIS — Z6825 Body mass index (BMI) 25.0-25.9, adult: Secondary | ICD-10-CM | POA: Diagnosis not present

## 2021-09-17 DIAGNOSIS — Z0000 Encounter for general adult medical examination without abnormal findings: Secondary | ICD-10-CM | POA: Diagnosis not present

## 2021-09-21 DIAGNOSIS — Z0000 Encounter for general adult medical examination without abnormal findings: Secondary | ICD-10-CM | POA: Diagnosis not present

## 2021-09-21 DIAGNOSIS — Z1331 Encounter for screening for depression: Secondary | ICD-10-CM | POA: Diagnosis not present

## 2021-10-03 ENCOUNTER — Other Ambulatory Visit (HOSPITAL_COMMUNITY): Payer: Self-pay

## 2021-12-20 ENCOUNTER — Other Ambulatory Visit (HOSPITAL_COMMUNITY): Payer: Self-pay

## 2021-12-20 ENCOUNTER — Other Ambulatory Visit (HOSPITAL_COMMUNITY): Payer: Self-pay | Admitting: Otolaryngology

## 2021-12-20 DIAGNOSIS — J343 Hypertrophy of nasal turbinates: Secondary | ICD-10-CM | POA: Diagnosis not present

## 2021-12-20 DIAGNOSIS — J320 Chronic maxillary sinusitis: Secondary | ICD-10-CM

## 2021-12-20 DIAGNOSIS — J324 Chronic pansinusitis: Secondary | ICD-10-CM | POA: Diagnosis not present

## 2021-12-20 DIAGNOSIS — J310 Chronic rhinitis: Secondary | ICD-10-CM | POA: Diagnosis not present

## 2021-12-20 MED ORDER — PREDNISONE 10 MG (48) PO TBPK
ORAL_TABLET | ORAL | 0 refills | Status: DC
  Filled 2021-12-20: qty 48, 12d supply, fill #0

## 2021-12-25 ENCOUNTER — Ambulatory Visit (HOSPITAL_COMMUNITY)
Admission: RE | Admit: 2021-12-25 | Discharge: 2021-12-25 | Disposition: A | Discharge status: Home / Self Care | Payer: 59 | Source: Ambulatory Visit | Attending: Otolaryngology | Admitting: Otolaryngology

## 2021-12-25 DIAGNOSIS — J320 Chronic maxillary sinusitis: Secondary | ICD-10-CM | POA: Diagnosis not present

## 2021-12-25 DIAGNOSIS — J3489 Other specified disorders of nose and nasal sinuses: Secondary | ICD-10-CM | POA: Diagnosis not present

## 2021-12-25 DIAGNOSIS — J342 Deviated nasal septum: Secondary | ICD-10-CM | POA: Diagnosis not present

## 2021-12-25 DIAGNOSIS — J324 Chronic pansinusitis: Secondary | ICD-10-CM | POA: Diagnosis not present

## 2022-01-07 ENCOUNTER — Other Ambulatory Visit (HOSPITAL_COMMUNITY): Payer: Self-pay

## 2022-01-09 ENCOUNTER — Encounter: Payer: 59 | Attending: Internal Medicine | Admitting: Registered"

## 2022-01-09 DIAGNOSIS — R638 Other symptoms and signs concerning food and fluid intake: Secondary | ICD-10-CM | POA: Insufficient documentation

## 2022-01-09 NOTE — Progress Notes (Signed)
Medical Nutrition Therapy  Appointment Start time:  [id number]  Appointment End time:  1745  Primary concerns today: [employer] Employee Visit #1  Referral diagnosis: N/A Preferred learning style: no preference indicated Learning readiness: ready    NUTRITION ASSESSMENT   Anthropometrics  Not taken today.   Clinical Medical Hx: N/A Medications: See list.  Labs: N/A Notable Signs/Symptoms: None reported.   Lifestyle & Dietary Hx Pt reports she isn't happy with her current wt. Reports very busy lifestyle with 3 kids and one having special needs. Would like to see what she needs daily and calorie amounts. Reports she struggles with time and motivation.  Pt reports she typically eats 2 eggs, bacon in morning for breakfast, snack of Skyr, lunch often includes pb&j from cafeteria and chips, dinner leftovers or sandwich.Tries to limit soda, typical beverages include half sweet tea, mostly water. Reports between lunch and dinner does fruit as snack. Dinner varies: common foods include chicken, ground beef, not very into fish. When eating out does Chick Fil A. Reports eats out mostly on weekends. Eats something sweet after dinner-ice cream or cookie.   Estimated daily fluid intake: 24-48 oz water, sometimes half sweet tea.  Supplements: Women's MVI, vitamin D 2000 IU, probiotic, B complex, vitamin C 500 mg.  Sleep: Difficult getting in adequate amount of sleep due to daughter with epilepsy may wake or seizure monitor often goes off during night.  Stress / self-care: busy schedule Current average weekly physical activity: None currently. Older daughter wants to start walking together.  24-Hr Dietary Recall First Meal 6 AM: 2 eggs, 1 slice bacon, 1 spoon honey + 4 spoons premier protein in coffee  Snack: None reported.  Second Meal 1230: Chick Fil A: 8 nuggets, fries, large half sweet tea Snack: 3 pretzel rods Third Meal 6-630: [employer]: pepperoni and mushrooms pizza x 4-5 small  squares Snack:[place] peanut butter ice cream x 2-3 scoops Beverages: 24-48 oz water, half sweet/unsweet tea, coffee  NUTRITION DIAGNOSIS  NB-1.1 Food and nutrition-related knowledge deficit As related to no prior nutrition education by dietitian reported.  As evidenced by pt requested nutrition education session with dietitian.   NUTRITION INTERVENTION  Nutrition education (E-1) on the following topics:  Provided education on balanced nutrition and mindful eating Worked with pt to set goals   Handouts Provided Include  Balanced plate and food list Balanced snack sheet.   Learning Style & Readiness for Change Teaching method utilized: Visual & Auditory  Demonstrated degree of understanding via: Teach Back  Barriers to learning/adherence to lifestyle change: Busy schedule.   Goals Established by Pt:  Instructions/Goals:  Goal #1: Pack lunch 3 days per week using balanced plate (1/4 plate protein + 1/2 plate vegetables + 1/4 plate starch)   Goal #2: Include 2 carb servings with breakfast for balance (whole grain OR fruit OR yogurt/milk).   Foods to Try:  [employer] and rice  Hummus with veggies  Kodiak brand brownies OR Fiber One  Frozen fruits and may add small amount chocolate Baked salmon (see recipe)  Misto (or similar) spray bottle for olive oil   Water Goal: 64 oz water   Make physical activity a part of your week. Try to include at least 30 minutes of physical activity 5 days each week or at least 150 minutes per week. Regular physical activity promotes overall health-including helping to reduce risk for heart disease and diabetes, promoting mental health, and helping [place] sleep better.  Goal: Walk 2-3 days x 30 minutes each week.    MONITORING & EVALUATION Dietary intake, weekly physical activity, in 6 weeks.  Next Steps  See goals.

## 2022-01-09 NOTE — Patient Instructions (Addendum)
Instructions/Goals:  Goal #1: Pack lunch 3 days per week using balanced plate (1/4 plate protein + 1/2 plate vegetables + 1/4 plate starch)   Goal #2: Include 2 carb servings with breakfast for balance (whole grain OR fruit OR yogurt/milk).   Foods to Try:  [employer] and rice  Hummus with veggies  Kodiak brand brownies OR Fiber One  Frozen fruits and may add small amount chocolate Baked salmon (see recipe)  Misto (or similar) spray bottle for olive oil   Water Goal: 64 oz water   Make physical activity a part of your week. Try to include at least 30 minutes of physical activity 5 days each week or at least 150 minutes per week. Regular physical activity promotes overall health-including helping to reduce risk for heart disease and diabetes, promoting mental health, and helping [place] sleep better.    Goal: Walk 2-3 days x 30 minutes each week.

## 2022-01-11 ENCOUNTER — Other Ambulatory Visit (HOSPITAL_COMMUNITY): Payer: Self-pay

## 2022-01-11 DIAGNOSIS — J343 Hypertrophy of nasal turbinates: Secondary | ICD-10-CM | POA: Diagnosis not present

## 2022-01-11 DIAGNOSIS — J324 Chronic pansinusitis: Secondary | ICD-10-CM | POA: Diagnosis not present

## 2022-01-11 DIAGNOSIS — J342 Deviated nasal septum: Secondary | ICD-10-CM | POA: Diagnosis not present

## 2022-01-11 DIAGNOSIS — J338 Other polyp of sinus: Secondary | ICD-10-CM | POA: Diagnosis not present

## 2022-01-11 MED ORDER — LEVOFLOXACIN 500 MG PO TABS
500.0000 mg | ORAL_TABLET | Freq: Every day | ORAL | 0 refills | Status: DC
  Filled 2022-01-11: qty 10, 10d supply, fill #0

## 2022-01-14 ENCOUNTER — Other Ambulatory Visit: Payer: Self-pay | Admitting: Otolaryngology

## 2022-01-16 ENCOUNTER — Encounter: Payer: Self-pay | Admitting: Registered"

## 2022-02-07 ENCOUNTER — Other Ambulatory Visit (HOSPITAL_COMMUNITY): Payer: Self-pay

## 2022-02-07 MED ORDER — PREDNISONE 10 MG (48) PO TBPK
ORAL_TABLET | ORAL | 0 refills | Status: DC
  Filled 2022-02-07: qty 48, 12d supply, fill #0

## 2022-02-22 ENCOUNTER — Encounter: Payer: 59 | Attending: Otolaryngology | Admitting: Registered"

## 2022-02-22 DIAGNOSIS — R638 Other symptoms and signs concerning food and fluid intake: Secondary | ICD-10-CM | POA: Insufficient documentation

## 2022-02-22 NOTE — Progress Notes (Signed)
Medical Nutrition Therapy  Appointment Start time:  0930  Appointment End time:  1000.  Primary concerns today: [employer] Employee Visit #2  Referral diagnosis: N/A Preferred learning style: no preference indicated Learning readiness: ready   NUTRITION ASSESSMENT   Anthropometrics  Not taken today.   Clinical Medical Hx: N/A Medications: See list.  Labs: N/A Notable Signs/Symptoms: None reported.   Lifestyle & Dietary Hx Pt reports some days working on goals goes well, others not as well due to lack of time. Reports she wants to do better. Has tried to cut down on sweets in evening and has been doing the [employer]. Reports they are satisfying as snack in evening. Reports not quite to water goal. Has been working to increase water intake.    Reports she is having sinus surgery at end of October. Reports taste is off, can tell sweet and salty but can't taste spices, seasonings. Reports her taste has not been the same since she had covid.   Pt reports it has been a little rough to pack due to busy time of year. 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 Reports doing leftovers for lunches often. Reports she has packed about 50% of the time. Reports having meals with half vegetables. Pt reports she would like to lose wt.   Estimated daily fluid intake: 40-50 oz water, sometimes half sweet tea.  Supplements: Women's MVI, vitamin D 2000 IU, probiotic, B complex, vitamin C 500 mg.  Sleep: Difficult getting in adequate amount of sleep due to daughter with epilepsy may wake or seizure monitor often goes off during night.  Stress / self-care: busy schedule Current average weekly physical activity: None currently. Older daughter wants to start walking together.  24-Hr Dietary Recall First Meal AM: typically 2 eggs, 1 slice bacon  Snack: None reported. Skyr yogurt when at work but not when at home Second Meal: typically leftovers OR lunch meat rolled with  provolone cheese x 1-2 pieces, baby carrots  Snack: apple OR scoop of Nutella with pretzels Third Meal: usually vegetables, protein and starch  Snack: sometimes [place] bar  Beverages: ~40-50 oz water  NUTRITION DIAGNOSIS  NB-1.1 Food and nutrition-related knowledge deficit As related to no prior nutrition education by dietitian reported.  As evidenced by pt requested nutrition education session with dietitian.   NUTRITION INTERVENTION  Nutrition education (E-1) on the following topics:  Praised progress made toward goals.  Discussed goal for gradual lifestyle changes which promote long-term changes and health vs extreme changes and fast and short-term wt loss.  Discussed relationship between unbalanced meals (specifically those void of carbs) and having cravings for sweets later in the day.  Discussed preparing larger quantities of meals and using leftovers for meal prepping and/or freezing for later.  Discussed walking goal.   Handouts Provided Include  None today.   Learning Style & Readiness for Change Teaching method utilized: Visual & Auditory  Demonstrated degree of understanding via: Teach Back  Barriers to learning/adherence to lifestyle change: Busy schedule.   Goals Established by Pt:  Instructions/Goals:  Goal #1: Pack lunch 3 days per week using balanced plate (1/4 plate protein + 1/2 plate vegetables + 1/4 plate starch)   Goal #2: Include a carb with each meal-recommend adding a fruit and whole grain with breakfast and lunch.   New Goal: Have 3 go-to meals prepped for busy days  Cook or freeze 3 extra meals to have as go-to dinners on busy  days every Monday  [email or web address]   Water Goal: 64 oz water   Make physical activity a part of your week. Try to include at least 30 minutes of physical activity 5 days each week or at least 150 minutes per week. Regular physical activity promotes overall health-including helping to reduce risk  for heart disease and diabetes, promoting mental health, and helping [place] sleep better.    Goal: Walk 2-3 days x 30 minutes each week.    MONITORING & EVALUATION Dietary intake, weekly physical activity, in 3 months.   Next Steps  See goals.

## 2022-02-22 NOTE — Patient Instructions (Addendum)
Instructions/Goals:  Goal #1: Pack lunch 3 days per week using balanced plate (1/4 plate protein + 1/2 plate vegetables + 1/4 plate starch)   Goal #2: Include a carb with each meal-recommend adding a fruit and whole grain with breakfast and lunch.   New Goal: Have 3 go-to meals prepped for busy days  Lacinda Axon or freeze 3 extra meals to have as go-to dinners on busy days every Monday  [email or web address]   Water Goal: 64 oz water   Make physical activity a part of your week. Try to include at least 30 minutes of physical activity 5 days each week or at least 150 minutes per week. Regular physical activity promotes overall health-including helping to reduce risk for heart disease and diabetes, promoting mental health, and helping [place] sleep better.    Goal: Walk 2-3 days x 30 minutes each week.

## 2022-02-28 ENCOUNTER — Encounter: Payer: Self-pay | Admitting: Registered"

## 2022-03-07 ENCOUNTER — Encounter (HOSPITAL_BASED_OUTPATIENT_CLINIC_OR_DEPARTMENT_OTHER): Payer: Self-pay | Admitting: Otolaryngology

## 2022-03-07 ENCOUNTER — Other Ambulatory Visit: Payer: Self-pay

## 2022-03-17 NOTE — Anesthesia Preprocedure Evaluation (Addendum)
Anesthesia Evaluation  Patient identified by MRN, date of birth, ID band Patient awake    Reviewed: Allergy & Precautions, NPO status , Patient's Chart, lab work & pertinent test results  Airway Mallampati: II  TM Distance: >3 FB Neck ROM: Full    Dental no notable dental hx.    Pulmonary neg pulmonary ROS,    Pulmonary exam normal        Cardiovascular negative cardio ROS Normal cardiovascular exam     Neuro/Psych negative neurological ROS  negative psych ROS   GI/Hepatic negative GI ROS, Neg liver ROS,   Endo/Other  negative endocrine ROS  Renal/GU negative Renal ROS     Musculoskeletal negative musculoskeletal ROS (+)   Abdominal   Peds  Hematology negative hematology ROS (+)   Anesthesia Other Findings DEVIATED SEPTUM  BILATERAL TURBINATE HYPERTROPHY  MAXILLARY SINUSITIS FRONTAL SINUSITIS ETHMOID SINUSITIS SPHENOID SINUSITIS  Reproductive/Obstetrics hcg negative                             Anesthesia Physical Anesthesia Plan  ASA: 1  Anesthesia Plan: General   Post-op Pain Management:    Induction: Intravenous  PONV Risk Score and Plan: 4 or greater and Ondansetron, Dexamethasone, Midazolam, Scopolamine patch - Pre-op and Treatment may vary due to age or medical condition  Airway Management Planned: Oral ETT  Additional Equipment:   Intra-op Plan:   Post-operative Plan: Extubation in OR  Informed Consent: I have reviewed the patients History and Physical, chart, labs and discussed the procedure including the risks, benefits and alternatives for the proposed anesthesia with the patient or authorized representative who has indicated his/her understanding and acceptance.     Dental advisory given  Plan Discussed with: CRNA  Anesthesia Plan Comments:        Anesthesia Quick Evaluation

## 2022-03-18 ENCOUNTER — Other Ambulatory Visit (HOSPITAL_COMMUNITY): Payer: Self-pay

## 2022-03-18 ENCOUNTER — Ambulatory Visit (HOSPITAL_BASED_OUTPATIENT_CLINIC_OR_DEPARTMENT_OTHER)
Admission: RE | Admit: 2022-03-18 | Discharge: 2022-03-18 | Disposition: A | Discharge status: Home / Self Care | Payer: 59 | Attending: Otolaryngology | Admitting: Otolaryngology

## 2022-03-18 ENCOUNTER — Ambulatory Visit (HOSPITAL_BASED_OUTPATIENT_CLINIC_OR_DEPARTMENT_OTHER): Payer: 59 | Admitting: Anesthesiology

## 2022-03-18 ENCOUNTER — Encounter (HOSPITAL_BASED_OUTPATIENT_CLINIC_OR_DEPARTMENT_OTHER): Payer: Self-pay | Admitting: Otolaryngology

## 2022-03-18 ENCOUNTER — Encounter (HOSPITAL_BASED_OUTPATIENT_CLINIC_OR_DEPARTMENT_OTHER)
Admission: RE | Disposition: A | Discharge status: Home / Self Care | Payer: Self-pay | Source: Home / Self Care | Attending: Otolaryngology

## 2022-03-18 DIAGNOSIS — J3489 Other specified disorders of nose and nasal sinuses: Secondary | ICD-10-CM | POA: Diagnosis not present

## 2022-03-18 DIAGNOSIS — J324 Chronic pansinusitis: Secondary | ICD-10-CM | POA: Diagnosis not present

## 2022-03-18 DIAGNOSIS — J342 Deviated nasal septum: Secondary | ICD-10-CM | POA: Insufficient documentation

## 2022-03-18 DIAGNOSIS — J320 Chronic maxillary sinusitis: Secondary | ICD-10-CM | POA: Diagnosis not present

## 2022-03-18 DIAGNOSIS — J339 Nasal polyp, unspecified: Secondary | ICD-10-CM

## 2022-03-18 DIAGNOSIS — J338 Other polyp of sinus: Secondary | ICD-10-CM | POA: Insufficient documentation

## 2022-03-18 DIAGNOSIS — J343 Hypertrophy of nasal turbinates: Secondary | ICD-10-CM | POA: Diagnosis not present

## 2022-03-18 DIAGNOSIS — J323 Chronic sphenoidal sinusitis: Secondary | ICD-10-CM | POA: Diagnosis not present

## 2022-03-18 DIAGNOSIS — Z01818 Encounter for other preprocedural examination: Secondary | ICD-10-CM

## 2022-03-18 DIAGNOSIS — R438 Other disturbances of smell and taste: Secondary | ICD-10-CM | POA: Insufficient documentation

## 2022-03-18 DIAGNOSIS — J309 Allergic rhinitis, unspecified: Secondary | ICD-10-CM | POA: Diagnosis not present

## 2022-03-18 DIAGNOSIS — U099 Post covid-19 condition, unspecified: Secondary | ICD-10-CM | POA: Insufficient documentation

## 2022-03-18 DIAGNOSIS — J321 Chronic frontal sinusitis: Secondary | ICD-10-CM | POA: Diagnosis not present

## 2022-03-18 DIAGNOSIS — J322 Chronic ethmoidal sinusitis: Secondary | ICD-10-CM | POA: Diagnosis not present

## 2022-03-18 HISTORY — PX: FRONTAL SINUS EXPLORATION: SHX6591

## 2022-03-18 HISTORY — PX: MAXILLARY ANTROSTOMY: SHX2003

## 2022-03-18 HISTORY — PX: NASAL SEPTOPLASTY W/ TURBINOPLASTY: SHX2070

## 2022-03-18 HISTORY — PX: ETHMOIDECTOMY: SHX5197

## 2022-03-18 HISTORY — PX: SINUS ENDO W/FUSION: SHX777

## 2022-03-18 LAB — POCT PREGNANCY, URINE: Preg Test, Ur: NEGATIVE

## 2022-03-18 SURGERY — SEPTOPLASTY, NOSE, WITH NASAL TURBINATE REDUCTION
Anesthesia: General | Site: Nose | Laterality: Bilateral

## 2022-03-18 MED ORDER — PROPOFOL 10 MG/ML IV BOLUS
INTRAVENOUS | Status: AC
  Filled 2022-03-18: qty 20

## 2022-03-18 MED ORDER — SODIUM CHLORIDE 0.9 % IR SOLN
Status: DC | PRN
  Administered 2022-03-18: 1

## 2022-03-18 MED ORDER — OXYCODONE-ACETAMINOPHEN 5-325 MG PO TABS
1.0000 | ORAL_TABLET | ORAL | 0 refills | Status: AC | PRN
  Filled 2022-03-18: qty 12, 2d supply, fill #0

## 2022-03-18 MED ORDER — DEXAMETHASONE SODIUM PHOSPHATE 4 MG/ML IJ SOLN
INTRAMUSCULAR | Status: DC | PRN
  Administered 2022-03-18: 10 mg via INTRAVENOUS

## 2022-03-18 MED ORDER — AMISULPRIDE (ANTIEMETIC) 5 MG/2ML IV SOLN
10.0000 mg | Freq: Once | INTRAVENOUS | Status: AC | PRN
  Administered 2022-03-18: 10 mg via INTRAVENOUS

## 2022-03-18 MED ORDER — KETOROLAC TROMETHAMINE 30 MG/ML IJ SOLN: 30.0000 mg | Freq: Once | INTRAMUSCULAR | Status: DC | PRN

## 2022-03-18 MED ORDER — SUGAMMADEX SODIUM 200 MG/2ML IV SOLN
INTRAVENOUS | Status: DC | PRN
  Administered 2022-03-18: 148.4 mg via INTRAVENOUS

## 2022-03-18 MED ORDER — PROPOFOL 10 MG/ML IV BOLUS
INTRAVENOUS | Status: DC | PRN
  Administered 2022-03-18: 200 mg via INTRAVENOUS

## 2022-03-18 MED ORDER — LACTATED RINGERS IV SOLN: INTRAVENOUS | Status: DC

## 2022-03-18 MED ORDER — ONDANSETRON HCL 4 MG/2ML IJ SOLN
INTRAMUSCULAR | Status: DC | PRN
  Administered 2022-03-18: 4 mg via INTRAVENOUS

## 2022-03-18 MED ORDER — COCAINE HCL 4 % EX SOLN
CUTANEOUS | Status: DC | PRN
  Administered 2022-03-18: 4 mL via TOPICAL

## 2022-03-18 MED ORDER — AMOXICILLIN-POT CLAVULANATE 875-125 MG PO TABS
1.0000 | ORAL_TABLET | Freq: Two times a day (BID) | ORAL | 0 refills | Status: AC
  Filled 2022-03-18: qty 6, 3d supply, fill #0

## 2022-03-18 MED ORDER — ACETAMINOPHEN 500 MG PO TABS
1000.0000 mg | ORAL_TABLET | Freq: Once | ORAL | Status: AC
  Administered 2022-03-18: 1000 mg via ORAL

## 2022-03-18 MED ORDER — SCOPOLAMINE 1 MG/3DAYS TD PT72
1.0000 | MEDICATED_PATCH | TRANSDERMAL | Status: DC
  Administered 2022-03-18: 1.5 mg via TRANSDERMAL

## 2022-03-18 MED ORDER — DEXAMETHASONE SODIUM PHOSPHATE 10 MG/ML IJ SOLN
INTRAMUSCULAR | Status: AC
  Filled 2022-03-18: qty 1

## 2022-03-18 MED ORDER — FENTANYL CITRATE (PF) 100 MCG/2ML IJ SOLN
INTRAMUSCULAR | Status: AC
  Filled 2022-03-18: qty 2

## 2022-03-18 MED ORDER — MIDAZOLAM HCL 2 MG/2ML IJ SOLN
INTRAMUSCULAR | Status: AC
  Filled 2022-03-18: qty 2

## 2022-03-18 MED ORDER — CEFAZOLIN SODIUM-DEXTROSE 2-3 GM-%(50ML) IV SOLR
INTRAVENOUS | Status: DC | PRN
  Administered 2022-03-18: 2 g via INTRAVENOUS

## 2022-03-18 MED ORDER — LIDOCAINE 2% (20 MG/ML) 5 ML SYRINGE
INTRAMUSCULAR | Status: AC
  Filled 2022-03-18: qty 5

## 2022-03-18 MED ORDER — AMISULPRIDE (ANTIEMETIC) 5 MG/2ML IV SOLN
INTRAVENOUS | Status: AC
  Filled 2022-03-18: qty 2

## 2022-03-18 MED ORDER — FENTANYL CITRATE (PF) 100 MCG/2ML IJ SOLN: 25.0000 ug | INTRAMUSCULAR | Status: DC | PRN

## 2022-03-18 MED ORDER — OXYCODONE HCL 5 MG/5ML PO SOLN: 5.0000 mg | Freq: Once | ORAL | Status: DC | PRN

## 2022-03-18 MED ORDER — 0.9 % SODIUM CHLORIDE (POUR BTL) OPTIME
TOPICAL | Status: DC | PRN
  Administered 2022-03-18: 150 mL

## 2022-03-18 MED ORDER — LIDOCAINE-EPINEPHRINE 1 %-1:100000 IJ SOLN
INTRAMUSCULAR | Status: AC
  Filled 2022-03-18: qty 1

## 2022-03-18 MED ORDER — MUPIROCIN 2 % EX OINT
TOPICAL_OINTMENT | CUTANEOUS | Status: AC
  Filled 2022-03-18: qty 22

## 2022-03-18 MED ORDER — OXYCODONE HCL 5 MG PO TABS: 5.0000 mg | ORAL_TABLET | Freq: Once | ORAL | Status: DC | PRN

## 2022-03-18 MED ORDER — LIDOCAINE HCL (CARDIAC) PF 100 MG/5ML IV SOSY
PREFILLED_SYRINGE | INTRAVENOUS | Status: DC | PRN
  Administered 2022-03-18: 60 mg via INTRAVENOUS

## 2022-03-18 MED ORDER — ONDANSETRON HCL 4 MG/2ML IJ SOLN
INTRAMUSCULAR | Status: AC
  Filled 2022-03-18: qty 2

## 2022-03-18 MED ORDER — ROCURONIUM BROMIDE 100 MG/10ML IV SOLN
INTRAVENOUS | Status: DC | PRN
  Administered 2022-03-18: 50 mg via INTRAVENOUS

## 2022-03-18 MED ORDER — SCOPOLAMINE 1 MG/3DAYS TD PT72
MEDICATED_PATCH | TRANSDERMAL | Status: AC
  Filled 2022-03-18: qty 1

## 2022-03-18 MED ORDER — MIDAZOLAM HCL 5 MG/5ML IJ SOLN
INTRAMUSCULAR | Status: DC | PRN
  Administered 2022-03-18: 2 mg via INTRAVENOUS

## 2022-03-18 MED ORDER — OXYMETAZOLINE HCL 0.05 % NA SOLN
NASAL | Status: AC
  Filled 2022-03-18: qty 30

## 2022-03-18 MED ORDER — LIDOCAINE-EPINEPHRINE 1 %-1:100000 IJ SOLN
INTRAMUSCULAR | Status: DC | PRN
  Administered 2022-03-18: 3 mL

## 2022-03-18 MED ORDER — ACETAMINOPHEN 500 MG PO TABS
ORAL_TABLET | ORAL | Status: AC
  Filled 2022-03-18: qty 2

## 2022-03-18 MED ORDER — FENTANYL CITRATE (PF) 100 MCG/2ML IJ SOLN
INTRAMUSCULAR | Status: DC | PRN
  Administered 2022-03-18: 100 ug via INTRAVENOUS
  Administered 2022-03-18: 25 ug via INTRAVENOUS
  Administered 2022-03-18: 50 ug via INTRAVENOUS
  Administered 2022-03-18: 25 ug via INTRAVENOUS

## 2022-03-18 MED ORDER — MUPIROCIN 2 % EX OINT
TOPICAL_OINTMENT | CUTANEOUS | Status: DC | PRN
  Administered 2022-03-18: 1 via TOPICAL

## 2022-03-18 MED ORDER — PROMETHAZINE HCL 25 MG/ML IJ SOLN: 6.2500 mg | INTRAMUSCULAR | Status: DC | PRN

## 2022-03-18 MED ORDER — OXYMETAZOLINE HCL 0.05 % NA SOLN
NASAL | Status: DC | PRN
  Administered 2022-03-18: 1 via TOPICAL

## 2022-03-18 SURGICAL SUPPLY — 53 items
ATTRACTOMAT 16X20 MAGNETIC DRP (DRAPES) IMPLANT
BLADE RAD40 ROTATE 4M 4 5PK (BLADE) IMPLANT
BLADE RAD60 ROTATE M4 4 5PK (BLADE) IMPLANT
BLADE ROTATE RAD 12 4 M4 (BLADE) IMPLANT
BLADE ROTATE RAD 40 4 M4 (BLADE) IMPLANT
BLADE ROTATE TRICUT 4X13 M4 (BLADE) ×1 IMPLANT
BLADE TRICUT ROTATE M4 4 5PK (BLADE) IMPLANT
BUR HS RAD FRONTAL 3 (BURR) IMPLANT
CANISTER SUC SOCK COL 7IN (MISCELLANEOUS) ×1 IMPLANT
CANISTER SUCT 1200ML W/VALVE (MISCELLANEOUS) ×1 IMPLANT
CNTNR URN SCR LID CUP LEK RST (MISCELLANEOUS) IMPLANT
COAGULATOR SUCT 8FR VV (MISCELLANEOUS) ×1 IMPLANT
CONT SPEC 4OZ STRL OR WHT (MISCELLANEOUS) ×1
DEFOGGER MIRROR 1QT (MISCELLANEOUS) ×1 IMPLANT
DRSG NASOPORE 8CM (GAUZE/BANDAGES/DRESSINGS) IMPLANT
DRSG TELFA 3X8 NADH STRL (GAUZE/BANDAGES/DRESSINGS) IMPLANT
ELECT REM PT RETURN 9FT ADLT (ELECTROSURGICAL) ×1
ELECTRODE REM PT RTRN 9FT ADLT (ELECTROSURGICAL) ×1 IMPLANT
GAUZE 4X4 16PLY ~~LOC~~+RFID DBL (SPONGE) IMPLANT
GLOVE BIO SURGEON STRL SZ7.5 (GLOVE) ×1 IMPLANT
GLOVE BIOGEL PI IND STRL 6.5 (GLOVE) IMPLANT
GLOVE SURG SS PI 6.5 STRL IVOR (GLOVE) IMPLANT
GOWN STRL REUS W/ TWL LRG LVL3 (GOWN DISPOSABLE) ×2 IMPLANT
GOWN STRL REUS W/TWL LRG LVL3 (GOWN DISPOSABLE) ×2
HEMOSTAT SURGICEL 2X14 (HEMOSTASIS) IMPLANT
IV NS 500ML (IV SOLUTION) ×1
IV NS 500ML BAXH (IV SOLUTION) ×1 IMPLANT
NDL HYPO 25X1 1.5 SAFETY (NEEDLE) ×1 IMPLANT
NEEDLE HYPO 25X1 1.5 SAFETY (NEEDLE) ×1 IMPLANT
NS IRRIG 1000ML POUR BTL (IV SOLUTION) ×1 IMPLANT
PACK BASIN DAY SURGERY FS (CUSTOM PROCEDURE TRAY) ×1 IMPLANT
PACK ENT DAY SURGERY (CUSTOM PROCEDURE TRAY) ×1 IMPLANT
SLEEVE SCD COMPRESS KNEE MED (STOCKING) ×1 IMPLANT
SPIKE FLUID TRANSFER (MISCELLANEOUS) IMPLANT
SPLINT NASAL AIRWAY SILICONE (MISCELLANEOUS) ×1 IMPLANT
SPONGE GAUZE 2X2 8PLY STRL LF (GAUZE/BANDAGES/DRESSINGS) ×1 IMPLANT
SPONGE NEURO XRAY DETECT 1X3 (DISPOSABLE) ×1 IMPLANT
SUCTION FRAZIER HANDLE 10FR (MISCELLANEOUS)
SUCTION TUBE FRAZIER 10FR DISP (MISCELLANEOUS) IMPLANT
SUT CHROMIC 4 0 P 3 18 (SUTURE) ×1 IMPLANT
SUT PLAIN 4 0 ~~LOC~~ 1 (SUTURE) ×1 IMPLANT
SUT PROLENE 3 0 PS 2 (SUTURE) ×1 IMPLANT
SUT VIC AB 4-0 P-3 18XBRD (SUTURE) IMPLANT
SUT VIC AB 4-0 P3 18 (SUTURE)
SYR 3ML 23GX1 SAFETY (SYRINGE) IMPLANT
SYR 50ML LL SCALE MARK (SYRINGE) IMPLANT
TOWEL GREEN STERILE FF (TOWEL DISPOSABLE) ×1 IMPLANT
TRACKER ENT INSTRUMENT (MISCELLANEOUS) ×1 IMPLANT
TRACKER ENT PATIENT (MISCELLANEOUS) ×1 IMPLANT
TUBE CONNECTING 20X1/4 (TUBING) ×1 IMPLANT
TUBE SALEM SUMP 16 FR W/ARV (TUBING) ×1 IMPLANT
TUBING STRAIGHTSHOT EPS 5PK (TUBING) ×1 IMPLANT
YANKAUER SUCT BULB TIP NO VENT (SUCTIONS) ×1 IMPLANT

## 2022-03-18 NOTE — Transfer of Care (Signed)
Immediate Anesthesia Transfer of Care Note  Patient: Ann Roberts [facility]  Procedure(s) Performed: NASAL SEPTOPLASTY WITH TURBINATE REDUCTION (Bilateral: Nose) ENDOSCOPIC MAXILLARY ANTROSTOMY WITH TISSUE REMOVAL (Bilateral: Nose) FRONTAL SINUS RECESS EXPLORATION (Bilateral: Nose) ETHMOIDECTOMY AND SPENOIDECTOMY WITH TISSUE REMOVAL (Bilateral: Nose) ENDOSCOPIC SINUS SURGERY WITH STEALTH NAVIGATION (Bilateral: Nose)  Patient Location: PACU  Anesthesia Type:General  Level of Consciousness: drowsy  Airway & Oxygen Therapy: Patient Spontanous Breathing and Patient connected to face mask oxygen  Post-op Assessment: Report given to RN and Post -op Vital signs reviewed and stable  Post vital signs: Reviewed and stable  Last Vitals:  Vitals Value Taken Time  BP 119/71 03/18/22 1039  Temp    Pulse 73 03/18/22 1040  Resp 20 03/18/22 1040  SpO2 96 % 03/18/22 1040  Vitals shown include unvalidated device data.  Last Pain:  Vitals:   03/18/22 0651  TempSrc: Oral  PainSc: 0-No pain         Complications: No notable events documented.

## 2022-03-18 NOTE — Discharge Instructions (Addendum)
May take Tylenol after 1pm, if needed.    Post Anesthesia Home Care Instructions  Activity: Get plenty of rest for the remainder of the day. A responsible individual must stay with you for 24 hours following the procedure.  For the next 24 hours, DO NOT: -Drive a car -Paediatric nurse -Drink alcoholic beverages -Take any medication unless instructed by your physician -Make any legal decisions or sign important papers.  Meals: Start with liquid foods such as gelatin or soup. Progress to regular foods as tolerated. Avoid greasy, spicy, heavy foods. If nausea and/or vomiting occur, drink only clear liquids until the nausea and/or vomiting subsides. Call your physician if vomiting continues.  Special Instructions/Symptoms: Your throat may feel dry or sore from the anesthesia or the breathing tube placed in your throat during surgery. If this causes discomfort, gargle with warm salt water. The discomfort should disappear within 24 hours.  If you had a scopolamine patch placed behind your ear for the management of post- operative nausea and/or vomiting:  1. The medication in the patch is effective for 72 hours, after which it should be removed.  Wrap patch in a tissue and discard in the trash. Wash hands thoroughly with soap and water. 2. You may remove the patch earlier than 72 hours if you experience unpleasant side effects which may include dry mouth, dizziness or visual disturbances. 3. Avoid touching the patch. Wash your hands with soap and water after contact with the patch.  -------------  POSTOPERATIVE INSTRUCTIONS FOR PATIENTS HAVING NASAL OR SINUS OPERATIONS ACTIVITY: Restrict activity at home for the first two days, resting as much as possible. Light activity is best. You may usually return to work within a week. You should refrain from nose blowing, strenuous activity, or heavy lifting greater than 20lbs for a total of one week after your operation.  If sneezing cannot be avoided,  sneeze with your mouth open. DISCOMFORT: You may experience a dull headache and pressure along with nasal congestion and discharge. These symptoms may be worse during the first week after the operation but may last as long as two to four weeks.  Please take Tylenol or the pain medication that has been prescribed for you. Do not take aspirin or aspirin containing medications since they may cause bleeding.  You may experience symptoms of post nasal drainage, nasal congestion, headaches and fatigue for two or three months after your operation.  BLEEDING: You may have some blood tinged nasal drainage for approximately two weeks after the operation.  The discharge will be worse for the first week.  Please call our office at [phone] or go to the nearest hospital emergency room if you experience any of the following: heavy, bright red blood from your nose or mouth that lasts longer than 15 minutes or coughing up or vomiting bright red blood or blood clots. GENERAL CONSIDERATIONS: A gauze dressing will be placed on your upper lip to absorb any drainage after the operation. You may need to change this several times a day.  If you do not have very much drainage, you may remove the dressing.  Remember that you may gently wipe your nose with a tissue and sniff in, but DO NOT blow your nose. Please keep all of your postoperative appointments.  Your final results after the operation will depend on proper follow-up.  The initial visit is usually 2 to 5 days after the operation.  During this visit, the remaining nasal packing and internal septal splints will be removed.  Your nasal and sinus cavities will be cleaned.  During the second visit, your nasal and sinus cavities will be cleaned again. Have someone drive you to your first two postoperative appointments.  How you care for your nose after the operation will influence the results that you obtain.  You should follow all directions, take your medication as  prescribed, and call our office [phone] with any problems or questions. You may be more comfortable sleeping with your head elevated on two pillows. Do not take any medications that we have not prescribed or recommended. WARNING SIGNS: if any of the following should occur, please call our office: Persistent fever greater than 102F. Persistent vomiting. Severe and constant pain that is not relieved by prescribed pain medication. Trauma to the nose. Rash or unusual side effects from any medicines.

## 2022-03-18 NOTE — Op Note (Signed)
DATE OF PROCEDURE: 03/18/2022  OPERATIVE REPORT   SURGEON: Leta Baptist, MD   PREOPERATIVE DIAGNOSES:  1. Nasal septal deviation.  2. Bilateral inferior turbinate hypertrophy.  3. Chronic nasal obstruction. 4. Bilateral chronic pansinusitis and polyposis  POSTOPERATIVE DIAGNOSES:  1. Nasal septal deviation.  2. Bilateral inferior turbinate hypertrophy.  3. Chronic nasal obstruction. 4. Bilateral chronic pansinusitis and polyposis  PROCEDURE PERFORMED:  1.  Septoplasty.  2.  Bilateral partial inferior turbinate resection.  3.  Bilateral endoscopic total ethmoidectomy and sphenoidotomy with polyp removal. 4.  Bilateral endoscopic frontal sinusotomy with polyp removal. 5.  Bilateral endoscopic maxillary antrostomy with polyp removal. 6.  FUSION stereotactic image guidance  ANESTHESIA: General endotracheal tube anesthesia.   COMPLICATIONS: None.   ESTIMATED BLOOD LOSS: 600 mL.   INDICATION FOR PROCEDURE: Ann Roberts is a 41 y.o. female with a history of bilateral chronic rhinosinusitis, nasal polyposis, and chronic nasal obstruction. The patient was treated with multiple courses of antibiotics, antihistamine, decongestant, systemic steroid and steroid nasal sprays. However, the patient continued to be symptomatic. On examination, the patient was noted to have bilateral sinonasal polyps, severe inferior turbinate hypertrophy and significant nasal septal deviation, causing significant nasal obstruction.  Her CT scan also showed bilateral pansinusitis, with complete opacification of all the paranasal sinuses.  Based on the above findings, the decision was made for the patient to undergo the above-stated procedures. The risks, benefits, alternatives, and details of the procedures were discussed with the patient. Questions were invited and answered. Informed consent was obtained.   DESCRIPTION OF PROCEDURE: The patient was taken to the operating room and placed supine on the operating  table. General endotracheal tube anesthesia was administered by the anesthesiologist. The patient was positioned, and prepped and draped in the standard fashion for nasal surgery. Pledgets soaked with Afrin were placed in both nasal cavities for decongestion. The pledgets were subsequently removed. The FUSION stereotactic image guidance marker was placed. The image guidance system was functional throughout the case.  Examination of the nasal cavity revealed a significant nasal septal deviation. 1% lidocaine with 1:100,000 epinephrine was injected onto the nasal septum bilaterally. A hemitransfixion incision was made on the left side. The mucosal flap was carefully elevated on the left side. A cartilaginous incision was made 1 cm superior to the caudal margin of the nasal septum. Mucosal flap was also elevated on the right side in the similar fashion. It should be noted that due to the severe septal deviation, the deviated portion of the cartilaginous and bony septum had to be removed in piecemeal fashion. Once the deviated portions were removed, a straight midline septum was achieved. The septum was then quilted with 4-0 plain gut sutures. The hemitransfixion incision was closed with interrupted 4-0 chromic sutures.   The inferior one half of both hypertrophied inferior turbinate was crossclamped with a Kelly clamp. The inferior one half of each inferior turbinate was then resected with a pair of cross cutting scissors. Hemostasis was achieved with a suction cautery device.   Using a 0 endoscope, the left nasal cavity was examined.  The middle turbinate was noted to be covered by polypoid tissue.  Using Tru-Cut forceps, the inferior one third and medial one half of the middle turbinate was resected. Polypoid tissue was noted within the middle meatus. The polypoid tissue was removed using a combination of microdebrider and Blakesley forceps. The uncinate process was resected with a freer elevator. The  maxillary antrum was entered and enlarged using a combination of  backbiter and microdebrider. Polypoid tissue and allergic fungal mucin were removed from the left maxillary sinus.  Attention was then focused on the ethmoid sinuses. The bony partitions of the anterior and posterior ethmoid cavities were taken down. Polypoid tissue was noted and removed.  More polyps were noted to obstruct the left sphenoid opening. The polyps were removed. The sphenoid opening was entered and enlarged.  More polypoid tissue was noted and removed from within the sphenoid sinus. Attention was then focused on the frontal sinus. The frontal recess was identified and enlarged by removing the surrounding bony partitions. Polypoid tissue was removed from the frontal recess. All 4 paranasal sinuses were copiously irrigated with saline solution.  The same procedure was repeated on the right side without exception. More polyps were noted on the right side. All polyps were removed. Doyle splints were applied to the nasal septum.  The care of the patient was turned over to the anesthesiologist. The patient was awakened from anesthesia without difficulty. The patient was extubated and transferred to the recovery room in good condition.   OPERATIVE FINDINGS: Severe nasal septal deviation and bilateral inferior turbinate hypertrophy.  Bilateral chronic pansinusitis.  Allergic fungal mucin was noted within the sinus cavities.  SPECIMEN: Bilateral sinus contents.  FOLLOWUP CARE: The patient be discharged home once she is awake and alert. The patient will be placed on Percocet p.r.n. pain, and Augmentin twice daily for 3 days. The patient will follow up in my office in 3 days for splint removal.   Shonn Farruggia Philomena Doheny, MD

## 2022-03-18 NOTE — H&P (Signed)
Cc: Chronic rhinosinusitis, chronic nasal obstruction  HPI: The patient is a 41 year old female who returns today for her follow-up evaluation.  She was last seen 3 weeks ago.  At that time, she was complaining of chronic nasal obstruction and recurrent sinusitis.  She was noted to have nasal mucosal congestion, nasal septal deviation, and bilateral inferior turbinate hypertrophy.  Polypoid tissue was noted to obstruct her middle meatus bilaterally.  She was treated with 12 days of high-dose steroid, Flonase nasal spray, and her allergy treatment regimen.  She subsequently underwent a sinus CT scan.  The CT showed bilateral pansinusitis with complete opacification of her paranasal sinuses.  She also has significant nasal septal deviation and bilateral inferior turbinate hypertrophy.  The patient returns today complaining of persistent nasal obstruction.  She also complains of chronic facial pain for the past 3 months.  She was treated previously with multiple courses of antibiotics, systemic and topical steroids, and over-the-counter sinus medications.  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  Exam: General: Communicates without difficulty, well nourished, no acute distress. Head: Normocephalic, no evidence injury, no tenderness, facial buttresses intact without stepoff. Eyes: PERRL, EOMI. No scleral icterus, conjunctivae clear. Neuro: CN II exam reveals vision grossly intact.  No nystagmus at any point of gaze. Ears: Auricles well formed without lesions.  Ear canals are intact without mass or lesion.  No erythema or edema is appreciated.  The TMs are intact without fluid. Nose: External evaluation reveals normal support and skin without lesions.  Dorsum is intact.  Anterior rhinoscopy reveals congested and edematous mucosa over anterior aspect of the inferior turbinates and nasal septum.  No purulence is noted. Middle meatus is not well visualized. Oral:  Oral  cavity and oropharynx are intact, symmetric, without erythema or edema.  Mucosa is moist without lesions. Neck: Full range of motion without pain.  There is no significant lymphadenopathy.  No masses palpable.  Thyroid bed within normal limits to palpation.  Parotid glands and submandibular glands equal bilaterally without mass.  Trachea is midline. Neuro:  CN 2-12 grossly intact. Gait normal. Vestibular: No nystagmus at any point of gaze. A flexible scope was inserted into the right nasal cavity.  Endoscopy of the interior nasal cavity, superior, inferior, and middle meatus was performed. The sphenoid-ethmoid recess was examined. Edematous mucosa was noted.  The nasal cavity was obstructed by polypoid tissue.  Nasal septal deviation noted.  Olfactory cleft was obstructed.  Nasopharynx was clear.  Turbinates were hypertrophied but without mass. The procedure was repeated on the contralateral side with similar findings.  The patient tolerated the procedure well.  Assessment: 1.  Bilateral chronic pansinusitis, involving all 4 pairs of paranasal sinuses.  Polypoid tissue is noted to obstruct her nasal cavities as well as her sinus cavities. 2.  Nasal septal deviation and bilateral inferior turbinate hypertrophy, obstructing more than 95% of her nasal passageways.  Plan: 1.  The nasal endoscopy findings and the CT images are reviewed with the patient. 2.  Continue with her steroid nasal sprays and allergy treatment regimen. 3.  Levofloxacin 500 mg daily for 10 days. 4.  In light of her persistent symptoms, she may benefit from surgical intervention with septoplasty, bilateral turbinate reduction, and bilateral endoscopic sinus surgery.  The risk, benefits, and details of the procedures are reviewed.  Questions are invited and answered.  The patient would like to proceed with the procedures.

## 2022-03-18 NOTE — Anesthesia Procedure Notes (Signed)
Procedure Name: Intubation Date/Time: 03/18/2022 7:43 AM  Performed by: Ezequiel Kayser, CRNAPre-anesthesia Checklist: Patient identified, Emergency Drugs available, Suction available and Patient being monitored Patient Re-evaluated:Patient Re-evaluated prior to induction Oxygen Delivery Method: Circle System Utilized Preoxygenation: Pre-oxygenation with 100% oxygen Induction Type: IV induction Ventilation: Mask ventilation without difficulty Laryngoscope Size: Mac and 3 Grade View: Grade I Tube type: Oral Tube size: 7.0 mm Number of attempts: 1 Airway Equipment and Method: Stylet and Oral airway Placement Confirmation: ETT inserted through vocal cords under direct vision, positive ETCO2 and breath sounds checked- equal and bilateral Secured at: 22 cm Tube secured with: Tape Dental Injury: Teeth and Oropharynx as per pre-operative assessment

## 2022-03-18 NOTE — Anesthesia Postprocedure Evaluation (Signed)
Anesthesia Post Note  Patient: Ann Roberts [facility]  Procedure(s) Performed: NASAL SEPTOPLASTY WITH TURBINATE REDUCTION (Bilateral: Nose) ENDOSCOPIC MAXILLARY ANTROSTOMY WITH TISSUE REMOVAL (Bilateral: Nose) FRONTAL SINUS RECESS EXPLORATION (Bilateral: Nose) ETHMOIDECTOMY AND SPENOIDECTOMY WITH TISSUE REMOVAL (Bilateral: Nose) ENDOSCOPIC SINUS SURGERY WITH STEALTH NAVIGATION (Bilateral: Nose)     Patient location during evaluation: PACU Anesthesia Type: General Level of consciousness: awake Pain management: pain level controlled Vital Signs Assessment: post-procedure vital signs reviewed and stable Respiratory status: spontaneous breathing, nonlabored ventilation, respiratory function stable and patient connected to nasal cannula oxygen Cardiovascular status: blood pressure returned to baseline and stable Postop Assessment: no apparent nausea or vomiting Anesthetic complications: no   No notable events documented.  Last Vitals:  Vitals:   03/18/22 1100 03/18/22 1152  BP: 112/72 104/73  Pulse: 68 71  Resp: 17 16  Temp:  36.9 C  SpO2: 96% 98%    Last Pain:  Vitals:   03/18/22 1152  TempSrc: Oral  PainSc: 0-No pain                 Ann Roberts P Ann Roberts

## 2022-03-19 ENCOUNTER — Encounter (HOSPITAL_BASED_OUTPATIENT_CLINIC_OR_DEPARTMENT_OTHER): Payer: Self-pay | Admitting: Otolaryngology

## 2022-03-19 LAB — SURGICAL PATHOLOGY

## 2022-03-21 DIAGNOSIS — J338 Other polyp of sinus: Secondary | ICD-10-CM | POA: Diagnosis not present

## 2022-03-21 DIAGNOSIS — J324 Chronic pansinusitis: Secondary | ICD-10-CM | POA: Diagnosis not present

## 2022-04-17 DIAGNOSIS — J324 Chronic pansinusitis: Secondary | ICD-10-CM | POA: Diagnosis not present

## 2022-04-17 DIAGNOSIS — J338 Other polyp of sinus: Secondary | ICD-10-CM | POA: Diagnosis not present

## 2022-05-09 DIAGNOSIS — J324 Chronic pansinusitis: Secondary | ICD-10-CM | POA: Diagnosis not present

## 2022-05-09 DIAGNOSIS — J338 Other polyp of sinus: Secondary | ICD-10-CM | POA: Diagnosis not present

## 2022-06-04 ENCOUNTER — Encounter: Payer: 59 | Attending: Internal Medicine | Admitting: Registered"

## 2022-06-04 DIAGNOSIS — R638 Other symptoms and signs concerning food and fluid intake: Secondary | ICD-10-CM

## 2022-06-04 NOTE — Progress Notes (Signed)
Medical Nutrition Therapy  Appointment Start time:  0830 Appointment End time:  0900.  Primary concerns today: [employer] Employee Visit #3  Referral diagnosis: N/A Preferred learning style: no preference indicated Learning readiness: ready   NUTRITION ASSESSMENT   Anthropometrics  Not taken today.   Clinical Medical Hx: N/A Medications: See list.  Labs: N/A Notable Signs/Symptoms: None reported.   Lifestyle & Dietary Hx  Pt reports taking lunch more often now to work. Reports eating half when eating out and packing rest for lunch the next day. Pt reports holidays were challenging nutrition wise. Keeps water bottle with her but struggles getting in enough, reports 30-~50 oz water daily. Reports also drinking coffee. Still getting all meals in regularly and usually consistent snack. Pt has been doing Skyr yogurt or apple for snacks. Trying to watch portion sizes. Pt has been trying eMeals. Reports her family has not been a fan of most of the dishes due to many being vegetarian they have tried. Reports she has been including whole grain pasta which has went over well with family. Pt reports her oldest daughter is trying to include more salads and she is going to try to do so with her. Pt reports not having enough time as main barrier to making nutritional changes.   Estimated daily fluid intake: 30-~50 oz water Supplements: Women's MVI, vitamin D 2000 IU, probiotic, B complex, vitamin C 500 mg.  Sleep: Difficult getting in adequate amount of sleep due to daughter with epilepsy may wake or seizure monitor often goes off during night.  Stress / self-care: busy schedule Current average weekly physical activity: None currently. Older daughter wants to start walking together.  24-Hr Dietary Recall First Meal 545 550 AM: typically 2 eggs, 1 slice bacon  Snack 629-52 AM: Skyr yogurt when at work but not when at home Second Meal: chicken quesadilla with chicken and cheese with tortilla, fresh  guac (onion and tomato, cilantro), water Snack: apple  Third Meal: chili, water Snack: None reported. *usually eats some ice cream but watches amount  Beverages: water  NUTRITION DIAGNOSIS  NB-1.1 Food and nutrition-related knowledge deficit As related to no prior nutrition education by dietitian reported.  As evidenced by pt requested nutrition education session with dietitian.   NUTRITION INTERVENTION  Nutrition education (E-1) on the following topics:  Praised progress made toward goals.  Discussed continuing to maintain progress made (packing meals for lunch most days, watching portions) and focusing on goal of increasing water to 64 oz  Handouts Provided Include  None today.   Learning Style & Readiness for Change Teaching method utilized: Visual & Auditory  Demonstrated degree of understanding via: Teach Back  Barriers to learning/adherence to lifestyle change: Busy schedule.   Goals Established by Pt:  Instructions/Goals:  Goal #1: Pack lunch at least 3 days per week using balanced plate (1/4 plate protein + 1/2 plate vegetables + 1/4 plate starch) Continue working to pack most days-good job!  Goal #2: Include one carb with breakfast such as fruit OR whole grain to increase fiber and provide balance.   Goal #2: Try for water x 64 oz or more.    MONITORING & EVALUATION Dietary intake, weekly physical activity, in 3 months.   Next Steps  See goals.  Pt to contact office is further f/u needed. Pt has completed 3 employee visits.

## 2022-06-04 NOTE — Patient Instructions (Addendum)
Instructions/Goals:  Goal #1: Pack lunch at least 3 days per week using balanced plate (1/4 plate protein + 1/2 plate vegetables + 1/4 plate starch) Continue working to pack most days-good job!  Goal #2: Include one carb with breakfast such as fruit OR whole grain to increase fiber and provide balance.   Goal #2: Try for water x 64 oz or more.

## 2022-06-07 ENCOUNTER — Encounter: Payer: Self-pay | Admitting: Registered"

## 2022-06-24 DIAGNOSIS — R0689 Other abnormalities of breathing: Secondary | ICD-10-CM | POA: Diagnosis not present

## 2022-06-24 DIAGNOSIS — R062 Wheezing: Secondary | ICD-10-CM | POA: Diagnosis not present

## 2022-06-26 ENCOUNTER — Other Ambulatory Visit: Payer: Self-pay

## 2022-06-26 ENCOUNTER — Ambulatory Visit (INDEPENDENT_AMBULATORY_CARE_PROVIDER_SITE_OTHER): Payer: 59

## 2022-06-26 ENCOUNTER — Other Ambulatory Visit: Payer: Self-pay | Admitting: Pulmonary Disease

## 2022-06-26 ENCOUNTER — Encounter: Payer: Self-pay | Admitting: Pulmonary Disease

## 2022-06-26 ENCOUNTER — Ambulatory Visit (INDEPENDENT_AMBULATORY_CARE_PROVIDER_SITE_OTHER): Payer: 59 | Admitting: Pulmonary Disease

## 2022-06-26 ENCOUNTER — Other Ambulatory Visit (HOSPITAL_COMMUNITY): Payer: Self-pay

## 2022-06-26 VITALS — BP 120/76 | HR 66 | Temp 98.4°F | Ht 67.0 in | Wt 160.0 lb

## 2022-06-26 DIAGNOSIS — J339 Nasal polyp, unspecified: Secondary | ICD-10-CM

## 2022-06-26 DIAGNOSIS — R0609 Other forms of dyspnea: Secondary | ICD-10-CM

## 2022-06-26 DIAGNOSIS — R062 Wheezing: Secondary | ICD-10-CM | POA: Diagnosis not present

## 2022-06-26 LAB — CBC WITH DIFFERENTIAL/PLATELET
Basophils Absolute: 0 10*3/uL (ref 0.0–0.1)
Basophils Relative: 0.5 % (ref 0.0–3.0)
Eosinophils Absolute: 0.5 10*3/uL (ref 0.0–0.7)
Eosinophils Relative: 6 % — ABNORMAL HIGH (ref 0.0–5.0)
HCT: 42.2 % (ref 36.0–46.0)
Hemoglobin: 14.1 g/dL (ref 12.0–15.0)
Lymphocytes Relative: 24 % (ref 12.0–46.0)
Lymphs Abs: 2 10*3/uL (ref 0.7–4.0)
MCHC: 33.5 g/dL (ref 30.0–36.0)
MCV: 84.3 fl (ref 78.0–100.0)
Monocytes Absolute: 0.5 10*3/uL (ref 0.1–1.0)
Monocytes Relative: 6.2 % (ref 3.0–12.0)
Neutro Abs: 5.2 10*3/uL (ref 1.4–7.7)
Neutrophils Relative %: 63.3 % (ref 43.0–77.0)
Platelets: 224 10*3/uL (ref 150.0–400.0)
RBC: 5 Mil/uL (ref 3.87–5.11)
RDW: 13.5 % (ref 11.5–15.5)
WBC: 8.2 10*3/uL (ref 4.0–10.5)

## 2022-06-26 LAB — POCT EXHALED NITRIC OXIDE: FeNO level (ppb): 139

## 2022-06-26 MED ORDER — BECLOMETHASONE DIPROP HFA 80 MCG/ACT IN AERB
2.0000 | INHALATION_SPRAY | Freq: Two times a day (BID) | RESPIRATORY_TRACT | 6 refills | Status: DC
  Filled 2022-06-26: qty 10.6, 30d supply, fill #0

## 2022-06-26 MED ORDER — ARNUITY ELLIPTA 100 MCG/ACT IN AEPB
2.0000 | INHALATION_SPRAY | Freq: Every day | RESPIRATORY_TRACT | 5 refills | Status: DC
  Filled 2022-06-26: qty 30, 30d supply, fill #0

## 2022-06-26 MED ORDER — ARNUITY ELLIPTA 200 MCG/ACT IN AEPB
1.0000 | INHALATION_SPRAY | Freq: Every day | RESPIRATORY_TRACT | 5 refills | Status: DC
  Filled 2022-06-26: qty 30, 30d supply, fill #0
  Filled 2022-07-26: qty 30, 30d supply, fill #1
  Filled 2022-09-05: qty 30, 30d supply, fill #2
  Filled 2022-10-02: qty 30, 30d supply, fill #3
  Filled 2022-11-06: qty 30, 30d supply, fill #4
  Filled 2023-06-13: qty 30, 30d supply, fill #0

## 2022-06-26 NOTE — Progress Notes (Signed)
Synopsis: Referred in 06/2022 for dyspnea after sinus surgery for nasal polyps and pansinusitis, concern for salicylate allergy  Subjective:   PATIENT ID: Ann Roberts GENDER: female DOB: 1981/05/16, MRN: [id number]   HPI  Chief Complaint  Patient presents with   Consult    Sob, wheezing,    Ann Roberts has been having wheezing, inability to take a deep breath and cough. > can't pinopoint a cause > it's happened twice with ibuprofen use > the first time it happened was after taking soft gels of ibuprofen> occurred 3 hours later > however she's also had some episodes of dyspnea and wheezing at night > she was started on Breztri and albuterol, but she hasn't taken it; [place] injection was given  > she said it started after sinus surgery, never had it before hand > she says taht her sinuses are more open now but she is still making mucus > she'll occassionally cough up mucus "from lower down", but mostly has sinus congestion > she is having daily symptoms of dyspnea and wheezing  No childhood illnesses, no asthma as a kid.  Never smoker.  Mother and father were smokers and smoked in the house when she was a kid.  Mother had squamous cell carcinoma in her jaw.   She's not had changes in her living environment, has been in the same house since 2016.  Got a couple of dogs a few years ago.  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  Her sense of smell remains limited.  She has been treated with steroids since sinus surgery and she had some recovery.  She continues to use a nasal steroid, she uses prn cetrizine.   She's not had a history of allergy symptoms over the years.  Husband smokes, outside. Record review: Sinus surgery by Dr. Benjamine Mola in 02/2022 for nasal polyps and chronic sinusitis: septoplasty, turbinate reduction, polypectomy.  Past Medical History:  Diagnosis Date    AMA (advanced maternal age) multigravida 32+    H/O toxoplasmosis    pt. denies   H/O varicella    History of cystitis    Hx: UTI (urinary tract infection)    x 1   Hx: UTI (urinary tract infection)    Infertility, female    No pertinent past medical history    Status post epidural steroid injection    ruptured disc L5 and S1     Family History  Problem Relation Age of Onset   Migraines Father    Heart disease Father    Hypertension Father    Hypertension Paternal Aunt    Factor V Leiden deficiency Paternal Aunt    Heart attack Maternal Grandmother    Diabetes Maternal Grandmother    Cancer Maternal Grandfather        lung   Dementia Paternal Grandmother    Mental illness Paternal Grandmother        Dementia    Hypertension Mother      Social History   Socioeconomic History   Marital status: Married    Spouse name: Not on file   Number of children: Not on file   Years of education: Not on file   Highest education level: Not on file  Occupational History   Not on file  Tobacco Use   Smoking status: Never   Smokeless tobacco: Never  Vaping Use   Vaping Use: Never  used  Substance and Sexual Activity   Alcohol use: Yes    Comment: rare   Drug use: No   Sexual activity: Yes  Other Topics Concern   Not on file  Social History Narrative   Not on file   Social Determinants of Health   Financial Resource Strain: Not on file  Food Insecurity: No Food Insecurity (01/16/2022)   Hunger Vital Sign    Worried About Running Out of Food in the Last Year: Never true    Ran Out of Food in the Last Year: Never true  Transportation Needs: Not on file  Physical Activity: Not on file  Stress: Not on file  Social Connections: Not on file  Intimate Partner Violence: Not on file     No Known Allergies   Outpatient Medications Prior to Visit  Medication Sig Dispense Refill   albuterol (VENTOLIN HFA) 108 (90 Base) MCG/ACT inhaler Inhale 1 puff into the lungs every 4  (four) hours as needed.     Ascorbic Acid (VITAMIN C WITH ROSE HIPS) 500 MG tablet Take 500 mg by mouth daily.     b complex vitamins capsule Take 1 capsule by mouth daily.     BREZTRI AEROSPHERE 160-9-4.8 MCG/ACT AERO Inhale 2 puffs into the lungs daily.     cetirizine (ZYRTEC) 10 MG chewable tablet Chew 10 mg by mouth daily.     Cholecalciferol (VITAMIN D) 50 MCG (2000 UT) tablet Take 2,000 Units by mouth daily.     Multiple Vitamins-Minerals (MULTIVIT/MULTIMINERAL ADULT PO) Take by mouth.     Probiotic Product (PROBIOTIC-10) CHEW Chew by mouth.     No facility-administered medications prior to visit.    Review of Systems  Constitutional:  Negative for chills, fever, malaise/fatigue and weight loss.  HENT:  Negative for congestion, nosebleeds, sinus pain and sore throat.   Eyes:  Negative for photophobia, pain and discharge.  Respiratory:  Positive for cough, shortness of breath and wheezing. Negative for hemoptysis and sputum production.   Cardiovascular:  Negative for chest pain, palpitations, orthopnea and leg swelling.  Gastrointestinal:  Negative for abdominal pain, constipation, diarrhea, nausea and vomiting.  Genitourinary:  Negative for dysuria, frequency, hematuria and urgency.  Musculoskeletal:  Negative for back pain, joint pain, myalgias and neck pain.  Skin:  Negative for itching and rash.  Neurological:  Negative for tingling, tremors, sensory change, speech change, focal weakness, seizures, weakness and headaches.  Psychiatric/Behavioral:  Negative for memory loss, substance abuse and suicidal ideas. The patient is not nervous/anxious.       Objective:  Physical Exam   Vitals:   06/26/22 0857  BP: 120/76  Pulse: 66  Temp: 98.4 F (36.9 C)  TempSrc: Oral  SpO2: 100%  Weight: 160 lb (72.6 kg)  Height: 5\' 7"  (1.702 m)    Gen: well appearing HENT: OP clear, neck supple PULM: CTA B, normal effort  CV: RRR, no mgr GI: BS+, soft, nontender Derm: no cyanosis  or rash Psyche: normal mood and affect    CBC    Component Value Date/Time   WBC 9.5 12/08/2016 0920   RBC 4.32 12/08/2016 0920   HGB 13.2 12/08/2016 0920   HCT 38.8 12/08/2016 0920   PLT 149 (L) 12/08/2016 0920   MCV 89.8 12/08/2016 0920   MCH 30.6 12/08/2016 0920   MCHC 34.0 12/08/2016 0920   RDW 14.0 12/08/2016 0920     Chest imaging:  PFT:' 06/26/2022 FENO 138 ppb  Labs:  Path:  Echo:  Heart Catheterization:  Other imaging: 12/2021 CT sinuses: pan sinusitis     Assessment & Plan:   Nasal polyps - Plan: CBC with Differential, IgE, POCT EXHALED NITRIC OXIDE  Wheezing - Plan: CBC with Differential, IgE, POCT EXHALED NITRIC OXIDE, Pulmonary function test  Dyspnea on exertion - Plan: CBC with Differential, IgE, POCT EXHALED NITRIC OXIDE, DG Chest 2 View  Discussion: Laiyah presents with wheezing in the face of nasal polyps, concern for possible salicylate allergy.  She may have a [name] type picture or adult onset asthma.  Has not been tested for allergies recently.  I think that there is sufficient concern that she may have adult onset asthma.  Wheezing with dyspnea, history of nasal polyps: Exhaled nitric oxide test result strongly suggests airway inflammation, asthma like syndrome Chest x-ray Use albuterol as needed for chest tightness wheezing or shortness of breath If you find that you are using albuterol more than twice a week during the daytime let me know so that I can prescribe an inhaled corticosteroid like Qvar 80 mcg 2 puffs twice daily CBC with differential Serum IgE Prepost bronchodilator spirometry testing  Follow-up with [place] in 2-4 weeks to go over the results of testing, NP or Maela Takeda visit  Immunizations: There is no immunization history for the selected administration types on file for this patient.   Current Outpatient Medications:    albuterol (VENTOLIN HFA) 108 (90 Base) MCG/ACT inhaler, Inhale 1 puff into the lungs every 4  (four) hours as needed., Disp: , Rfl:    Ascorbic Acid (VITAMIN C WITH ROSE HIPS) 500 MG tablet, Take 500 mg by mouth daily., Disp: , Rfl:    b complex vitamins capsule, Take 1 capsule by mouth daily., Disp: , Rfl:    BREZTRI AEROSPHERE 160-9-4.8 MCG/ACT AERO, Inhale 2 puffs into the lungs daily., Disp: , Rfl:    cetirizine (ZYRTEC) 10 MG chewable tablet, Chew 10 mg by mouth daily., Disp: , Rfl:    Cholecalciferol (VITAMIN D) 50 MCG (2000 UT) tablet, Take 2,000 Units by mouth daily., Disp: , Rfl:    Multiple Vitamins-Minerals (MULTIVIT/MULTIMINERAL ADULT PO), Take by mouth., Disp: , Rfl:    Probiotic Product (PROBIOTIC-10) CHEW, Chew by mouth., Disp: , Rfl:

## 2022-06-26 NOTE — Progress Notes (Signed)
Order clarification

## 2022-06-26 NOTE — Patient Instructions (Addendum)
Wheezing with dyspnea, history of nasal polyps: Exhaled nitric oxide test Chest x-ray Use albuterol as needed for chest tightness wheezing or shortness of breath Qvar 80 mcg 2 puffs twice daily CBC with differential Serum IgE Prepost bronchodilator spirometry testing  Follow-up with [place] in 2-4 weeks to go over the results of testing, NP or Javae Braaten visit

## 2022-06-27 LAB — IGE: IgE (Immunoglobulin E), Serum: 83 kU/L (ref <=114)

## 2022-07-01 ENCOUNTER — Encounter: Payer: Self-pay | Admitting: Pulmonary Disease

## 2022-07-01 NOTE — Telephone Encounter (Signed)
Mychart message sent by pt: Ann Roberts [facility] (supporting Juanito Doom, MD)47 minutes ago (10:19 AM)    Dr Lake Bells,   I do think the ICS is helping. I was wondering if you have ever heard of [name]? One of our outpatient pharmacist mentioned it to me because he had similar symptoms after sinus surgery.    Thanks, Ann Roberts    Dr. Lake Bells, please advise.

## 2022-07-12 ENCOUNTER — Encounter: Payer: Self-pay | Admitting: Primary Care

## 2022-07-12 ENCOUNTER — Other Ambulatory Visit (HOSPITAL_COMMUNITY): Payer: Self-pay

## 2022-07-12 ENCOUNTER — Ambulatory Visit (INDEPENDENT_AMBULATORY_CARE_PROVIDER_SITE_OTHER): Payer: 59 | Admitting: Pulmonary Disease

## 2022-07-12 ENCOUNTER — Ambulatory Visit (INDEPENDENT_AMBULATORY_CARE_PROVIDER_SITE_OTHER): Payer: 59 | Admitting: Primary Care

## 2022-07-12 VITALS — BP 118/62 | HR 84 | Ht 68.0 in | Wt 161.0 lb

## 2022-07-12 DIAGNOSIS — R062 Wheezing: Secondary | ICD-10-CM | POA: Diagnosis not present

## 2022-07-12 DIAGNOSIS — J4530 Mild persistent asthma, uncomplicated: Secondary | ICD-10-CM

## 2022-07-12 LAB — PULMONARY FUNCTION TEST
DL/VA % pred: 106 %
DL/VA: 4.56 ml/min/mmHg/L
DLCO cor % pred: 112 %
DLCO cor: 27.72 ml/min/mmHg
DLCO unc % pred: 114 %
DLCO unc: 28.3 ml/min/mmHg
FEF 25-75 Post: 3.23 L/sec
FEF 25-75 Pre: 3.02 L/sec
FEF2575-%Change-Post: 6 %
FEF2575-%Pred-Post: 99 %
FEF2575-%Pred-Pre: 92 %
FEV1-%Change-Post: 1 %
FEV1-%Pred-Post: 104 %
FEV1-%Pred-Pre: 102 %
FEV1-Post: 3.5 L
FEV1-Pre: 3.43 L
FEV1FVC-%Change-Post: 3 %
FEV1FVC-%Pred-Pre: 94 %
FEV6-%Change-Post: 0 %
FEV6-%Pred-Post: 107 %
FEV6-%Pred-Pre: 108 %
FEV6-Post: 4.37 L
FEV6-Pre: 4.41 L
FEV6FVC-%Pred-Post: 102 %
FEV6FVC-%Pred-Pre: 102 %
FVC-%Change-Post: -1 %
FVC-%Pred-Post: 105 %
FVC-%Pred-Pre: 106 %
FVC-Post: 4.37 L
FVC-Pre: 4.44 L
Post FEV1/FVC ratio: 80 %
Post FEV6/FVC ratio: 100 %
Pre FEV1/FVC ratio: 77 %
Pre FEV6/FVC Ratio: 100 %
RV % pred: 143 %
RV: 2.64 L
TLC % pred: 125 %
TLC: 7.08 L

## 2022-07-12 MED ORDER — MONTELUKAST SODIUM 10 MG PO TABS
10.0000 mg | ORAL_TABLET | Freq: Every day | ORAL | 11 refills | Status: DC
  Filled 2022-07-12: qty 30, 30d supply, fill #0
  Filled 2022-09-06: qty 30, 30d supply, fill #1

## 2022-07-12 NOTE — Patient Instructions (Signed)
Normal spirometry- there was no evidence of obstructive or restrictive lung disease on breathing test Total lung capacity is increased which can be indicative of hyperinflation which you can see with asthma   Eosinophils, IgE and FENO are elevated indicating allergic component to your asthma   Recommendations: Start Montelukast (Singulair) '10mg'$  at bedtime daily  Continue Arnuity one puff daily in the morning (rinse mouth after use) Use Albuterol 2 puffs every 4-6 hours as needed for breakthrough sob/wheezing If needing Albuterol more than normal or getting no relief with rescue inhaler notify office   Follow-up: 3 months with Dr. Lake Bells   Asthma, Adult  Asthma is a condition that causes swelling and narrowing of the airways. These are the passages that lead from the nose and mouth down into the lungs. When asthma symptoms get worse it is called an asthma attack or flare. This can make it hard to breathe. Asthma flares can range from minor to life-threatening. There is no cure for asthma, but medicines and lifestyle changes can help to control it. What are the causes? It is not known exactly what causes asthma, but certain things can cause asthma symptoms to get worse (triggers). What can trigger an asthma attack? Cigarette smoke. Mold. Dust. Your pet's skin flakes (dander). Cockroaches. Pollen. Air pollution (like household cleaners, wood smoke, smog, or Advertising account planner). What are the signs or symptoms? Trouble breathing (shortness of breath). Coughing. Making high-pitched whistling sounds when you breathe, most often when you breathe out (wheezing). Chest tightness. Tiredness with little activity. Poor exercise tolerance. How is this treated? Controller medicines that help prevent asthma symptoms. Fast-acting reliever or rescue medicines. These give short-term relief of asthma symptoms. Allergy medicines if your attacks are brought on by allergens. Medicines to help control the  body's defense (immune) system. Staying away from the things that cause asthma attacks. Follow these instructions at home: Avoiding triggers in your home Do not allow anyone to smoke in your home. Limit use of fireplaces and wood stoves. Get rid of pests (such as roaches and mice) and their droppings. Keep your home clean. Clean your floors. Dust regularly. Use cleaning products that do not smell. Wash bed sheets and blankets every week in hot water. Dry them in a dryer. Have someone vacuum when you are not home. Change your heating and air conditioning filters often. Use blankets that are made of polyester or cotton. General instructions Take over-the-counter and prescription medicines only as told by your doctor. Do not smoke or use any products that contain nicotine or tobacco. If you need help quitting, ask your doctor. Stay away from secondhand smoke. Avoid doing things outdoors when allergen counts are high and when air quality is low. Warm up before you exercise. Take time to cool down after exercise. Use a peak flow meter as told by your doctor. A peak flow meter is a tool that measures how well your lungs are working. Keep track of the peak flow meter's readings. Write them down. Follow your asthma action plan. This is a written plan for taking care of your asthma and treating your attacks. Make sure you get all the shots (vaccines) that your doctor recommends. Ask your doctor about a flu shot and a pneumonia shot. Keep all follow-up visits. Contact a doctor if: You have wheezing, shortness of breath, or a cough even while taking medicine to prevent attacks. The mucus you cough up (sputum) is thicker than usual. The mucus you cough up changes from clear or white  to yellow, green, gray, or is bloody. You have problems from the medicine you are taking, such as: A rash. Itching. Swelling. Trouble breathing. You need reliever medicines more than 2-3 times a week. Your peak flow  reading is still at 50-79% of your personal best after following the action plan for 1 hour. You have a fever. Get help right away if: You seem to be worse and are not responding to medicine during an asthma attack. You are short of breath even at rest. You get short of breath when doing very little activity. You have trouble eating, drinking, or talking. You have chest pain or tightness. You have a fast heartbeat. Your lips or fingernails start to turn blue. You are light-headed or dizzy, or you faint. Your peak flow is less than 50% of your personal best. You feel too tired to breathe normally. These symptoms may be an emergency. Get help right away. Call 911. Do not wait to see if the symptoms will go away. Do not drive yourself to the hospital. Summary Asthma is a long-term (chronic) condition in which the airways get tight and narrow. An asthma attack can make it hard to breathe. Asthma cannot be cured, but medicines and lifestyle changes can help control it. Make sure you understand how to avoid triggers and how and when to use your medicines. Avoid things that can cause allergy symptoms (allergens). These include animal skin flakes (dander) and pollen from trees or grass. Avoid things that pollute the air. These may include household cleaners, wood smoke, smog, or chemical odors. This information is not intended to replace advice given to you by your health care provider. Make sure you discuss any questions you have with your health care provider. Document Revised: 02/12/2021 Document Reviewed: 02/12/2021 Elsevier Patient Education  [place].

## 2022-07-12 NOTE — Progress Notes (Signed)
Full PFT Performed Today. 

## 2022-07-12 NOTE — Patient Instructions (Signed)
Full PFT Performed Today. 

## 2022-07-12 NOTE — Progress Notes (Addendum)
$'@Patient'd$  ID: Ann Roberts, female    DOB: 08-22-1980, 42 y.o.   MRN: [id number]  Chief Complaint  Patient presents with   Follow-up    PFT:07/12/2022 Breathing is good    Referring provider: Nicoletta Dress, MD  HPI:  Ann Roberts has been having wheezing, inability to take a deep breath and cough. > can't pinopoint a cause > it's happened twice with ibuprofen use > the first time it happened was after taking soft gels of ibuprofen> occurred 3 hours later > however Ann Roberts's also had some episodes of dyspnea and wheezing at night > Ann Roberts was started on Breztri and albuterol, but Ann Roberts hasn't taken it; [place] injection was given  > Ann Roberts said it started after sinus surgery, never had it before hand > Ann Roberts says taht Ann Roberts sinuses are more open now but Ann Roberts is still making mucus > Ann Roberts'll occassionally cough up mucus "from lower down", but mostly has sinus congestion > Ann Roberts is having daily symptoms of dyspnea and wheezing  No childhood illnesses, no asthma as a kid.  Never smoker.  Mother and father were smokers and smoked in the house when Ann Roberts was a kid.  Mother had squamous cell carcinoma in Ann Roberts jaw.   Ann Roberts's not had changes in Ann Roberts living environment, has been in the same house since 2016.  Got a couple of dogs a few years ago.  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  Ann Roberts lost taste and smell with limited recovery.  Ann Roberts sense of smell remains limited.  Ann Roberts has been treated with steroids since sinus surgery and Ann Roberts had some recovery.  Ann Roberts continues to use a nasal steroid, Ann Roberts uses prn cetrizine.   Ann Roberts's not had a history of allergy symptoms over the years.  Husband smokes, outside. Record review: Sinus surgery by Dr. Benjamine Mola in 02/2022 for nasal polyps and chronic sinusitis: septoplasty, turbinate reduction, polypectomy.  07/12/2022- Interim hx  Patient presents today for follow-up. Ann Roberts saw Dr. Pennie Banter on 06/26/22 for wheezing. Ann Roberts  parents smoked and Ann Roberts lived with them. Ann Roberts had pulmonary function testing today without evidence of obstruction, increased total lung capacity. CXR on 06/26/22 showed clear lungs. Hx nasal polyps and chronic sinusitis. Ann Roberts has had turbinate reduction and polypectomy. Ann Roberts takes Zyrtec '10mg'$  daily along with nasal steroid. Breathing improved with Arnuity.    No Known Allergies  Immunization History  Administered Date(s) Administered   Influenza Whole 03/11/2022    Past Medical History:  Diagnosis Date   AMA (advanced maternal age) multigravida 35+    H/O toxoplasmosis    pt. denies   H/O varicella    History of cystitis    Hx: UTI (urinary tract infection)    x 1   Hx: UTI (urinary tract infection)    Infertility, female    No pertinent past medical history    Status post epidural steroid injection    ruptured disc L5 and S1    Tobacco History: Social History   Tobacco Use  Smoking Status Never  Smokeless Tobacco Never   Counseling given: Not Answered   Outpatient Medications Prior to Visit  Medication Sig Dispense Refill   albuterol (VENTOLIN HFA) 108 (90 Base) MCG/ACT inhaler Inhale 1 puff into the lungs every 4 (four) hours as needed.     Ascorbic Acid (VITAMIN C WITH ROSE HIPS) 500 MG tablet Take 500 mg by mouth daily.     b complex vitamins capsule  Take 1 capsule by mouth daily.     calcium elemental as carbonate (BARIATRIC TUMS ULTRA) 400 MG chewable tablet Chew by mouth.     cetirizine (ZYRTEC) 10 MG chewable tablet Chew 10 mg by mouth daily.     Cholecalciferol (VITAMIN D) 50 MCG (2000 UT) tablet Take 2,000 Units by mouth daily.     Fluticasone Furoate (ARNUITY ELLIPTA) 200 MCG/ACT AEPB Inhale 1 puff into the lungs daily. 30 each 5   GNP BUDESONIDE NASAL SPRAY NA Place into the nose.     Multiple Vitamins-Minerals (MULTIVIT/MULTIMINERAL ADULT PO) Take by mouth.     Probiotic Product (PROBIOTIC-10) CHEW Chew by mouth.     No facility-administered medications prior  to visit.      Review of Systems  Review of Systems  Constitutional: Negative.   HENT:  Positive for postnasal drip.   Respiratory:  Negative for shortness of breath and wheezing.   Cardiovascular: Negative.      Physical Exam  BP 118/62 (BP Location: Left Arm, Patient Position: Sitting, Cuff Size: Normal)   Pulse 84   Ht '5\' 8"'$  (1.727 m)   Wt 161 lb (73 kg)   SpO2 98%   BMI 24.48 kg/m  Physical Exam Constitutional:      Appearance: Normal appearance.  HENT:     Mouth/Throat:     Mouth: Mucous membranes are moist.     Pharynx: Oropharynx is clear.  Cardiovascular:     Rate and Rhythm: Normal rate and regular rhythm.  Pulmonary:     Effort: Pulmonary effort is normal.     Breath sounds: Normal breath sounds.  Musculoskeletal:        General: Normal range of motion.  Skin:    General: Skin is warm and dry.  Neurological:     General: No focal deficit present.     Mental Status: Ann Roberts is alert and oriented to person, place, and time. Mental status is at baseline.  Psychiatric:        Mood and Affect: Mood normal.        Behavior: Behavior normal.        Thought Content: Thought content normal.        Judgment: Judgment normal.      Lab Results:  CBC    Component Value Date/Time   WBC 8.2 06/26/2022 0953   RBC 5.00 06/26/2022 0953   HGB 14.1 06/26/2022 0953   HCT 42.2 06/26/2022 0953   PLT 224.0 06/26/2022 0953   MCV 84.3 06/26/2022 0953   MCH 30.6 12/08/2016 0920   MCHC 33.5 06/26/2022 0953   RDW 13.5 06/26/2022 0953   LYMPHSABS 2.0 06/26/2022 0953   MONOABS 0.5 06/26/2022 0953   EOSABS 0.5 06/26/2022 0953   BASOSABS 0.0 06/26/2022 0953    BMET No results found for: "NA", "K", "CL", "CO2", "GLUCOSE", "BUN", "CREATININE", "CALCIUM", "GFRNONAA", "GFRAA"  BNP No results found for: "BNP"  ProBNP No results found for: "PROBNP"  Imaging: DG Chest 2 View  Result Date: 06/26/2022 CLINICAL DATA:  Shortness of breath EXAM: CHEST - 2 VIEW COMPARISON:   None Available. FINDINGS: The heart size and mediastinal contours are within normal limits. Both lungs are clear. The visualized skeletal structures are unremarkable. IMPRESSION: No active cardiopulmonary disease. Electronically Signed   By: Dorise Bullion III M.D.   On: 06/26/2022 13:43     Assessment & Plan:   Reactive airway disease Normal spirometry. Total lung capacity is increased which can be indicative of hyperinflation. Eosinophils,  IgE and FENO are elevated indicating allergic component to asthma   Recommendations: Start Montelukast (Singulair) '10mg'$  at bedtime daily  Continue Arnuity one puff daily in the morning (rinse mouth after use) Use Albuterol 2 puffs every 4-6 hours as needed for breakthrough sob/wheezing If needing Albuterol more than normal or getting no relief with rescue inhaler notify office   Follow-up: 3 months with Dr. Tomasa Blase, NP 07/22/2022

## 2022-07-22 DIAGNOSIS — J45909 Unspecified asthma, uncomplicated: Secondary | ICD-10-CM | POA: Insufficient documentation

## 2022-07-22 NOTE — Assessment & Plan Note (Signed)
Normal spirometry. Total lung capacity is increased which can be indicative of hyperinflation. Eosinophils, IgE and FENO are elevated indicating allergic component to asthma   Recommendations: Start Montelukast (Singulair) '10mg'$  at bedtime daily  Continue Arnuity one puff daily in the morning (rinse mouth after use) Use Albuterol 2 puffs every 4-6 hours as needed for breakthrough sob/wheezing If needing Albuterol more than normal or getting no relief with rescue inhaler notify office   Follow-up: 3 months with Dr. Lake Bells

## 2022-07-26 ENCOUNTER — Other Ambulatory Visit (HOSPITAL_COMMUNITY): Payer: Self-pay

## 2022-08-06 DIAGNOSIS — N8189 Other female genital prolapse: Secondary | ICD-10-CM | POA: Diagnosis not present

## 2022-08-06 DIAGNOSIS — Z124 Encounter for screening for malignant neoplasm of cervix: Secondary | ICD-10-CM | POA: Diagnosis not present

## 2022-08-06 DIAGNOSIS — Z01419 Encounter for gynecological examination (general) (routine) without abnormal findings: Secondary | ICD-10-CM | POA: Diagnosis not present

## 2022-08-06 DIAGNOSIS — Z1231 Encounter for screening mammogram for malignant neoplasm of breast: Secondary | ICD-10-CM | POA: Diagnosis not present

## 2022-08-06 DIAGNOSIS — N816 Rectocele: Secondary | ICD-10-CM | POA: Diagnosis not present

## 2022-08-06 DIAGNOSIS — Z6824 Body mass index (BMI) 24.0-24.9, adult: Secondary | ICD-10-CM | POA: Diagnosis not present

## 2022-08-06 DIAGNOSIS — Z3040 Encounter for surveillance of contraceptives, unspecified: Secondary | ICD-10-CM | POA: Diagnosis not present

## 2022-08-12 ENCOUNTER — Other Ambulatory Visit (HOSPITAL_COMMUNITY): Payer: Self-pay

## 2022-08-27 DIAGNOSIS — H5213 Myopia, bilateral: Secondary | ICD-10-CM | POA: Diagnosis not present

## 2022-09-04 DIAGNOSIS — J324 Chronic pansinusitis: Secondary | ICD-10-CM | POA: Diagnosis not present

## 2022-09-04 DIAGNOSIS — J338 Other polyp of sinus: Secondary | ICD-10-CM | POA: Diagnosis not present

## 2022-09-05 ENCOUNTER — Other Ambulatory Visit (HOSPITAL_COMMUNITY): Payer: Self-pay

## 2022-09-25 DIAGNOSIS — Z0000 Encounter for general adult medical examination without abnormal findings: Secondary | ICD-10-CM | POA: Diagnosis not present

## 2022-10-02 ENCOUNTER — Other Ambulatory Visit (HOSPITAL_COMMUNITY): Payer: Self-pay

## 2022-11-07 ENCOUNTER — Other Ambulatory Visit (HOSPITAL_COMMUNITY): Payer: Self-pay

## 2022-12-10 ENCOUNTER — Ambulatory Visit: Payer: Self-pay | Admitting: Allergy

## 2022-12-31 ENCOUNTER — Other Ambulatory Visit (HOSPITAL_COMMUNITY): Payer: Self-pay

## 2022-12-31 ENCOUNTER — Ambulatory Visit (INDEPENDENT_AMBULATORY_CARE_PROVIDER_SITE_OTHER): Payer: 59 | Admitting: Allergy

## 2022-12-31 ENCOUNTER — Encounter: Payer: Self-pay | Admitting: Allergy

## 2022-12-31 VITALS — BP 126/72 | HR 66 | Resp 18 | Ht 67.0 in | Wt 160.6 lb

## 2022-12-31 DIAGNOSIS — J3089 Other allergic rhinitis: Secondary | ICD-10-CM | POA: Diagnosis not present

## 2022-12-31 DIAGNOSIS — J302 Other seasonal allergic rhinitis: Secondary | ICD-10-CM | POA: Diagnosis not present

## 2022-12-31 DIAGNOSIS — J45998 Other asthma: Secondary | ICD-10-CM

## 2022-12-31 DIAGNOSIS — H1013 Acute atopic conjunctivitis, bilateral: Secondary | ICD-10-CM

## 2022-12-31 DIAGNOSIS — J339 Nasal polyp, unspecified: Secondary | ICD-10-CM | POA: Diagnosis not present

## 2022-12-31 DIAGNOSIS — Z886 Allergy status to analgesic agent status: Secondary | ICD-10-CM | POA: Diagnosis not present

## 2022-12-31 MED ORDER — AIRSUPRA 90-80 MCG/ACT IN AERO
2.0000 | INHALATION_SPRAY | RESPIRATORY_TRACT | 1 refills | Status: DC | PRN
  Filled 2022-12-31: qty 10.7, 17d supply, fill #0

## 2022-12-31 NOTE — Patient Instructions (Addendum)
-  Constellation of symptoms including polyp history with removal, increased sinus symptoms, wheeze/respiratory symptoms following NSAID administration is consistent with [name]/[name].  -Would avoid NSAID medications at this time.  Once on Dupic -Consider Dupixent therapy as means of controlling [name] (respiratory and polyps).  Dupixent for polyp/asthma control is done every 2 weeks and can be self-administered at home. Dupixent benefits/risk discussed and informational brochure provided.  -For nasal polyp control Xhance 1-2 sprays 1-2 times a day pending symptoms will be more effective than standard nasal spray like Budesonide.  Sample provided - Environmental allergy testing is positive to grass pollen, weed pollen, tree pollen, outdoor mold, cat, dog and horse.  Allergen avoidance measures provided. For allergy symptom control if needed can take antihistamine like Zyrtec, Allegra or Xyzal.  For itchy/watery eyes symptoms can use Pataday as needed Xhance as above for nasal congestion symptoms.  - Would recommend Airsupra as rescue inhaler of choice. Airsupra 2 puffs every 4 hours as needed for wheeze/cough/shortness of breath/chest tightness.   Follow-up in 3-4 months or sooner if needed

## 2022-12-31 NOTE — Progress Notes (Unsigned)
New Patient Note  RE: NAYVEE Roberts MRN: [id number] DOB: [birth year] Date of Office Visit: 12/31/2022  Primary care provider: Paulina Fusi, MD  Chief Complaint: Allergies/congestion  History of present illness: [name] is a 42 y.o. female presenting today for evaluation of rhinitis.  She reports nasal congestion.  She states after having Covid she had worsening nasal congestion as she was quite obstructed.  With COVID illness she did lose taste and smell which she states has improved but is still quite muted.  The congestion was so bad that she was completely obstructed.  She states she had received prednisone due to the obstruction.  She saw ENT provider in 2022.  She saw Dr Suszanne Conners next also with ENT and had a sinus CT performed and was diagnosed with pansinutis.  She has had sinus surgery in October 2023 with Dr Suszanne Conners with septoplasty and "cleaned everything out" as she states she had pansinusitis.  She also had polyps.  She has been using budesonide nasal spray daily to help prevent polyp regrowth.  After the surgery the congestion was improved however she was noting other issues. After the surgery she states she started having some respiratory issues with wheezing that was occurring daily.  She felt like she was having an asthma attack but did not have diagnosis of asthma.  She saw pulmonary who did a workup including pulmonary function studies and was diagnosed with adult onset asthma.   She was also noting that when she would take ibuprofen the knee symptoms would develop.  She would also notice increase in sinus symptoms with ibuprofen as well. She was noticed the symptoms somewhere around 30-45 minutes after taking softgel ibuprofen she would have profuse drainage, ear pressure, head pressure and throat discomfort and wheeze.  She also noticed with alcohol ingestion she could have some wheezing.  The pulmonologist started her on arnuity which improved the wheeze day-to-day.   She has been essentially avoiding ibuprofen however this is her preferred medication for pain control.  Review of systems: 10pt ROS negative unless noted above in HPI  Past medical history: Past Medical History:  Diagnosis Date   AMA (advanced maternal age) multigravida 35+    H/O toxoplasmosis    pt. denies   H/O varicella    History of cystitis    Hx: UTI (urinary tract infection)    x 1   Hx: UTI (urinary tract infection)    Infertility, female    No pertinent past medical history    Status post epidural steroid injection    ruptured disc L5 and S1    Past surgical history: Past Surgical History:  Procedure Laterality Date   ANTERIOR CRUCIATE LIGAMENT REPAIR  1996   R   epidural steroids  2007   x2 for ruptured disc in back   ETHMOIDECTOMY Bilateral 03/18/2022   Procedure: ETHMOIDECTOMY AND SPENOIDECTOMY WITH TISSUE REMOVAL;  Surgeon: Newman Pies, MD;  Location: [facility];  Service: ENT;  Laterality: Bilateral;   FRONTAL SINUS EXPLORATION Bilateral 03/18/2022   Procedure: FRONTAL SINUS RECESS EXPLORATION;  Surgeon: Newman Pies, MD;  Location: [facility];  Service: ENT;  Laterality: Bilateral;   MAXILLARY ANTROSTOMY Bilateral 03/18/2022   Procedure: ENDOSCOPIC MAXILLARY ANTROSTOMY WITH TISSUE REMOVAL;  Surgeon: Newman Pies, MD;  Location: [facility];  Service: ENT;  Laterality: Bilateral;   NASAL SEPTOPLASTY W/ TURBINOPLASTY Bilateral 03/18/2022   Procedure: NASAL SEPTOPLASTY WITH TURBINATE REDUCTION;  Surgeon: Newman Pies, MD;  Location: [facility];  Service: ENT;  Laterality: Bilateral;   SINUS ENDO W/FUSION Bilateral 03/18/2022   Procedure: ENDOSCOPIC SINUS SURGERY WITH STEALTH NAVIGATION;  Surgeon: Newman Pies, MD;  Location:  [facility];  Service: ENT;  Laterality: Bilateral;   TONSILLECTOMY  2002   WISDOM TOOTH EXTRACTION      Family history:  Family History  Problem Relation Age of Onset   Migraines  Father    Heart disease Father    Hypertension Father    Hypertension Paternal Aunt    Factor V Leiden deficiency Paternal Aunt    Heart attack Maternal Grandmother    Diabetes Maternal Grandmother    Cancer Maternal Grandfather        lung   Dementia Paternal Grandmother    Mental illness Paternal Grandmother        Dementia    Hypertension Mother     Social history: Lives in a home with carpeting in the bedroom with gas and electric heating with central and fan cooling.  Dogs in the home.  No concern for water damage, mildew or roaches in the home.  She is a Teacher, early years/pre.  She denies a smoking history.   Medication List: Current Outpatient Medications  Medication Sig Dispense Refill   albuterol (VENTOLIN HFA) 108 (90 Base) MCG/ACT inhaler Inhale 1 puff into the lungs every 4 (four) hours as needed.     Albuterol-Budesonide (AIRSUPRA) 90-80 MCG/ACT AERO Inhale 2 puffs into the lungs every 4 (four) hours as needed. 10.7 g 1   Ascorbic Acid (VITAMIN C WITH ROSE HIPS) 500 MG tablet Take 500 mg by mouth daily.     b complex vitamins capsule Take 1 capsule by mouth daily.     calcium elemental as carbonate (BARIATRIC TUMS ULTRA) 400 MG chewable tablet Chew by mouth.     Cholecalciferol (VITAMIN D) 50 MCG (2000 UT) tablet Take 2,000 Units by mouth daily.     Fluticasone Furoate (ARNUITY ELLIPTA) 200 MCG/ACT AEPB Inhale 1 puff into the lungs daily. 30 each 5   GNP BUDESONIDE NASAL SPRAY NA Place into the nose.     Multiple Vitamins-Minerals (MULTIVIT/MULTIMINERAL ADULT PO) Take by mouth.     Probiotic Product (PROBIOTIC-10) CHEW Chew by mouth.     No current facility-administered medications for this visit.    Known medication allergies: No Known Allergies   Physical examination: Blood pressure 126/72, pulse 66, resp. rate 18, height 5\' 7"  (1.702 m), weight 160 lb 9.6 oz (72.8 kg), SpO2 99%.  General: Alert, interactive, in no acute distress. HEENT: PERRLA, TMs pearly gray,  turbinates non-edematous with thick discharge, post-pharynx non erythematous. Neck: Supple without lymphadenopathy. Lungs: Clear to auscultation without wheezing, rhonchi or rales. {no increased work of breathing. CV: Normal S1, S2 without murmurs. Abdomen: Nondistended, nontender. Skin: Warm and dry, without lesions or rashes. Extremities:  No clubbing, cyanosis or edema. Neuro:   Grossly intact.  Diagnositics/Labs:  Spirometry: FEV1: 3.32L 103%, FVC: 4.34L 110%, ratio consistent with nonobstructive pattern  Allergy testing:   Airborne Adult Perc - 12/31/22 1443     Time Antigen Placed 1444    Allergen Manufacturer Waynette Buttery    Location Back    Number of Test 55    Panel 1 Select    1. Control-Buffer 50% Glycerol Negative    2. Control-Histamine 2+    3. Bahia 2+    4. French Southern Territories Negative    5. Johnson Negative    6. Kentucky Blue 3+  7. Meadow Fescue 4+    8. Perennial Rye 2+    9. Timothy 4+    10. Ragweed Mix Negative    11. Cocklebur 2+    12. Plantain,  English 2+    13. Baccharis 2+    14. Dog Fennel Negative    15. Russian Thistle Negative    16. Lamb's Quarters Negative    17. Sheep Sorrell Negative    18. Rough Pigweed Negative    19. Marsh Elder, Rough Negative    20. Mugwort, Common 2+    21. Box, Elder 2+    22. Cedar, red Negative    23. Sweet Gum Negative    24. Pecan Pollen 3+    25. Pine Mix Negative    26. Walnut, Black Pollen 2+    27. Red Mulberry Negative    28. Ash Mix Negative    29. Birch Mix Negative    30. Beech American Negative    31. Cottonwood, Guinea-Bissau 2+    32. Hickory, White 2+    33. Maple Mix 4+    34. Oak, Guinea-Bissau Mix 2+    35. Sycamore Eastern Negative    36. Alternaria Alternata Negative    37. Cladosporium Herbarum Negative    38. Aspergillus Mix Negative    39. Penicillium Mix Negative    40. Bipolaris Sorokiniana (Helminthosporium) Negative    41. Drechslera Spicifera (Curvularia) 2+    42. Mucor Plumbeus Negative     43. Fusarium Moniliforme Negative    44. Aureobasidium Pullulans (pullulara) 2+    45. Rhizopus Oryzae Negative    46. Botrytis Cinera Negative    47. Epicoccum Nigrum Negative    48. Phoma Betae Negative    49. Dust Mite Mix Negative    50. Cat Hair 10,000 BAU/ml 3+    51.  Dog Epithelia 2+    52. Mixed Feathers Negative    53. Horse Epithelia 2+    54. Cockroach, German Negative    55. Tobacco Leaf Negative             Allergy testing results were read and interpreted by provider, documented by clinical staff.   Assessment and plan: Ibuprofen exacerbated respiratory disease (variant of [name]) Allergic rhinoconjunctivitis Nasal polyposis status post resection  -Constellation of symptoms including polyp history with removal, increased sinus symptoms, wheeze/respiratory symptoms following NSAID administration is consistent with [name]/[name].  -Would avoid NSAID medications at this time.  Once on Dupic -Consider Dupixent therapy as means of controlling [name] (respiratory and polyps).  Dupixent for polyp/asthma control is done every 2 weeks and can be self-administered at home. Dupixent benefits/risk discussed and informational brochure provided.  -For nasal polyp control Xhance 1-2 sprays 1-2 times a day pending symptoms will be more effective than standard nasal spray like Budesonide.  Sample provided - Environmental allergy testing is positive to grass pollen, weed pollen, tree pollen, outdoor mold, cat, dog and horse.  Allergen avoidance measures provided. For allergy symptom control if needed can take antihistamine like Zyrtec, Allegra or Xyzal.  For itchy/watery eyes symptoms can use Pataday as needed Xhance as above for nasal congestion symptoms.  - Would recommend Airsupra as rescue inhaler of choice. Airsupra 2 puffs every 4 hours as needed for wheeze/cough/shortness of breath/chest tightness.   Follow-up in 3-4 months or sooner if needed  I appreciate the  opportunity to take part in Minie's care. Please do not hesitate to contact me with questions.  Sincerely,  Margo Aye, MD Allergy/Immunology [facility]

## 2023-01-03 ENCOUNTER — Other Ambulatory Visit (HOSPITAL_COMMUNITY): Payer: Self-pay

## 2023-02-05 DIAGNOSIS — E538 Deficiency of other specified B group vitamins: Secondary | ICD-10-CM | POA: Diagnosis not present

## 2023-02-05 DIAGNOSIS — E559 Vitamin D deficiency, unspecified: Secondary | ICD-10-CM | POA: Diagnosis not present

## 2023-02-05 DIAGNOSIS — A6920 Lyme disease, unspecified: Secondary | ICD-10-CM | POA: Diagnosis not present

## 2023-02-05 DIAGNOSIS — Z8616 Personal history of COVID-19: Secondary | ICD-10-CM | POA: Diagnosis not present

## 2023-02-05 DIAGNOSIS — N959 Unspecified menopausal and perimenopausal disorder: Secondary | ICD-10-CM | POA: Diagnosis not present

## 2023-02-05 DIAGNOSIS — R5383 Other fatigue: Secondary | ICD-10-CM | POA: Diagnosis not present

## 2023-03-05 ENCOUNTER — Encounter (INDEPENDENT_AMBULATORY_CARE_PROVIDER_SITE_OTHER): Payer: Self-pay

## 2023-03-05 ENCOUNTER — Ambulatory Visit (INDEPENDENT_AMBULATORY_CARE_PROVIDER_SITE_OTHER): Payer: 59 | Admitting: Otolaryngology

## 2023-03-05 VITALS — Ht 68.0 in | Wt 155.0 lb

## 2023-03-05 DIAGNOSIS — J324 Chronic pansinusitis: Secondary | ICD-10-CM

## 2023-03-05 DIAGNOSIS — J338 Other polyp of sinus: Secondary | ICD-10-CM

## 2023-03-10 DIAGNOSIS — J338 Other polyp of sinus: Secondary | ICD-10-CM | POA: Insufficient documentation

## 2023-03-10 DIAGNOSIS — J324 Chronic pansinusitis: Secondary | ICD-10-CM | POA: Insufficient documentation

## 2023-03-10 NOTE — Progress Notes (Unsigned)
Patient ID: Ann Roberts, female   DOB: [birth year], 42 y.o.   MRN: [id number]  Follow-up: Bilateral chronic pansinusitis and polyposis  HPI: The patient is a 42 year old female who presents today complaining of recurrent sinusitis.  The patient has a history of bilateral chronic pansinusitis and polyposis.  She underwent bilateral endoscopic sinus surgery, septoplasty, and turbinate reduction in October 2023.  The patient returns today reporting more recurrent sinusitis and increasing nasal obstruction.  She has noted increasing nasal congestion over the past 2 months.  She is currently on Xhance nasal spray.  She was also diagnosed with adult onset asthma after her surgery.  Currently she denies any fever or visual change.  Exam: General: Communicates without difficulty, well nourished, no acute distress. Head: Normocephalic, no evidence injury, no tenderness, facial buttresses intact without stepoff. Face/sinus: No tenderness to palpation and percussion. Facial movement is normal and symmetric. Eyes: PERRL, EOMI. No scleral icterus, conjunctivae clear. Neuro: CN II exam reveals vision grossly intact.  No nystagmus at any point of gaze. Ears: Auricles well formed without lesions.  Ear canals are intact without mass or lesion.  No erythema or edema is appreciated.  The TMs are intact without fluid. Nose: External evaluation reveals normal support and skin without lesions.  Dorsum is intact.  Anterior rhinoscopy reveals congested mucosa over anterior aspect of inferior turbinates and intact septum.  Recurrent polypoid tissue is noted bilaterally, with a small amount of crusting within the nasal cavities.  Oral:  Oral cavity and oropharynx are intact, symmetric, without erythema or edema.  Mucosa is moist without lesions. Neck: Full range of motion without pain.  There is no significant lymphadenopathy.  No masses palpable.  Thyroid bed within normal limits to palpation.  Parotid glands and submandibular  glands equal bilaterally without mass.  Trachea is midline. Neuro:  CN 2-12 grossly intact.    Assessment: 1.  Bilateral chronic rhinosinusitis and polyposis, with recurrent exacerbations. 2.  Recurrent polyposis is noted bilaterally, with a small amount of crusting in both nasal cavities.  Plan: 1.  The physical exam findings are extensive the reviewed with the patient. 2.  Continue with excellent nasal spray daily. 3.  I would like to start the patient on daily medicated rinse with mupirocin/budesonide/amphotericin B.  The instructions on how to perform the irrigation are reviewed. 4.  The patient will return for reevaluation in 2 months, sooner if needed.

## 2023-03-31 ENCOUNTER — Ambulatory Visit (HOSPITAL_BASED_OUTPATIENT_CLINIC_OR_DEPARTMENT_OTHER)
Admission: EM | Admit: 2023-03-31 | Discharge: 2023-03-31 | Disposition: A | Discharge status: Home / Self Care | Payer: 59 | Attending: Internal Medicine | Admitting: Internal Medicine

## 2023-03-31 ENCOUNTER — Encounter (HOSPITAL_BASED_OUTPATIENT_CLINIC_OR_DEPARTMENT_OTHER): Payer: Self-pay

## 2023-03-31 ENCOUNTER — Other Ambulatory Visit (HOSPITAL_BASED_OUTPATIENT_CLINIC_OR_DEPARTMENT_OTHER): Payer: Self-pay

## 2023-03-31 DIAGNOSIS — J020 Streptococcal pharyngitis: Secondary | ICD-10-CM | POA: Diagnosis not present

## 2023-03-31 MED ORDER — AMOXICILLIN 875 MG PO TABS
875.0000 mg | ORAL_TABLET | Freq: Two times a day (BID) | ORAL | 0 refills | Status: AC
  Filled 2023-03-31: qty 20, 10d supply, fill #0

## 2023-03-31 NOTE — ED Triage Notes (Signed)
Pt c/o sore throat x2-3 days. White spots noted to throat. Recent exposures to strep.

## 2023-03-31 NOTE — ED Provider Notes (Signed)
[facility]    CSN: [id number] Arrival date & time: 03/31/23  0804      History   Chief Complaint Chief Complaint  Patient presents with   Sore Throat    HPI CERAH RYCHLIK is a 42 y.o. female.   HPI Sore throat for 2 days associated with painful swallowing.  Noted some lesions on the back of her throat today.  Admits recent contact with someone with strep, daughter has also been ill. Has chronic sinus congestion, nasal drainage and postnasal drip.  Denies fever, chills, sweats, headache, chest pain, shortness of breath, nausea, vomiting, diarrhea.  Past Medical History:  Diagnosis Date   AMA (advanced maternal age) multigravida 35+    H/O toxoplasmosis    pt. 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    Maternal age 44+, multigravida, antepartum    Hx of neonatal death in prior pregnancy--occurred 2 days after delivery 03/06/2012    Past Surgical History:  Procedure Laterality Date   ANTERIOR CRUCIATE LIGAMENT REPAIR  1996   R   epidural steroids  2007   x2 for ruptured disc in back   ETHMOIDECTOMY Bilateral 03/18/2022   Procedure: ETHMOIDECTOMY AND SPENOIDECTOMY WITH TISSUE REMOVAL;  Surgeon: Newman Pies, MD;  Location: [facility];  Service: ENT;  Laterality: Bilateral;   FRONTAL SINUS EXPLORATION Bilateral 03/18/2022   Procedure: FRONTAL SINUS RECESS EXPLORATION;  Surgeon: Newman Pies, MD;  Location: [facility];  Service: ENT;  Laterality: Bilateral;   MAXILLARY ANTROSTOMY Bilateral 03/18/2022   Procedure: ENDOSCOPIC MAXILLARY ANTROSTOMY WITH TISSUE REMOVAL;  Surgeon: Newman Pies, MD;  Location: [facility];  Service: ENT;  Laterality: Bilateral;   NASAL SEPTOPLASTY W/ TURBINOPLASTY Bilateral 03/18/2022   Procedure: NASAL SEPTOPLASTY WITH TURBINATE REDUCTION;  Surgeon: Newman Pies, MD;  Location: [facility];  Service: ENT;  Laterality: Bilateral;   SINUS ENDO W/FUSION Bilateral 03/18/2022   Procedure: ENDOSCOPIC SINUS SURGERY WITH STEALTH NAVIGATION;  Surgeon: Newman Pies, MD;  Location: [facility];  Service: ENT;  Laterality: Bilateral;   TONSILLECTOMY  2002   WISDOM TOOTH EXTRACTION      OB History     Gravida  4   Para  3   Term  3   Preterm  0   AB  0   Living  2      SAB  0   IAB  0   Ectopic  0   Multiple  0   Live Births  3            Home Medications    Prior to Admission medications   Medication Sig Start Date End Date Taking? Authorizing Provider  amoxicillin (AMOXIL) 875 MG tablet Take 1 tablet (875 mg total) by mouth 2 (two) times daily for 10 days. 03/31/23 04/10/23 Yes  Meliton Rattan, PA  albuterol (VENTOLIN HFA) 108 (90 Base) MCG/ACT inhaler Inhale 1 puff into the lungs every 4 (four) hours as needed. 06/24/22   [provider]  Albuterol-Budesonide (AIRSUPRA) 90-80 MCG/ACT AERO Inhale 2 puffs into the lungs every 4 (four) hours as needed. 12/31/22   Marcelyn Bruins, MD  Ascorbic Acid (VITAMIN C WITH ROSE HIPS) 500 MG tablet Take 500 mg by mouth daily.    [provider]  b complex vitamins capsule Take 1 capsule by mouth daily.    [provider]  calcium elemental as carbonate (BARIATRIC TUMS ULTRA) 400 MG chewable tablet Chew by mouth. Patient not taking: Reported on 03/05/2023    [provider]  Cholecalciferol (VITAMIN D) 50 MCG (2000 UT) tablet Take  2,000 Units by mouth daily.    [provider]  Fluticasone Furoate (ARNUITY ELLIPTA) 200 MCG/ACT AEPB Inhale 1 puff into the lungs daily. Patient not taking: Reported on 03/05/2023 06/26/22   Lupita Leash, MD  GNP BUDESONIDE NASAL SPRAY NA Place into the nose. Patient not taking: Reported on 03/05/2023    [provider]  Multiple Vitamins-Minerals (MULTIVIT/MULTIMINERAL ADULT PO) Take by mouth.    [provider]  Probiotic Product (PROBIOTIC-10) CHEW Chew by mouth.    [provider]    Family History Family History  Problem Relation Age of Onset   Migraines Father    Heart disease Father    Hypertension Father    Hypertension Paternal Aunt    Factor V Leiden deficiency Paternal Aunt    Heart attack Maternal Grandmother    Diabetes Maternal Grandmother    Cancer Maternal Grandfather        lung   Dementia Paternal Grandmother    Mental illness Paternal Grandmother        Dementia    Hypertension Mother     Social History Social History   Tobacco Use   Smoking status: Never   Smokeless tobacco: Never  Vaping Use   Vaping status: Never Used  Substance Use Topics   Alcohol use: Yes    Comment: rare   Drug use: No     Allergies   Nsaids   Review of Systems Review of Systems  Constitutional:  Positive for fatigue. Negative for chills and fever.  HENT:  Positive for postnasal drip, rhinorrhea, sinus pain and sore throat. Negative for ear pain, trouble swallowing and voice change.   Respiratory:  Negative for cough and shortness of breath.   Gastrointestinal:  Negative for diarrhea, nausea and vomiting.     Physical Exam Triage Vital Signs ED Triage Vitals  Encounter Vitals Group     BP      Systolic BP Percentile      Diastolic BP Percentile      Pulse      Resp      Temp      Temp src      SpO2      Weight      Height      Head Circumference      Peak Flow      Pain Score      Pain Loc      Pain Education       Exclude from Growth Chart    No data found.  Updated Vital Signs BP 119/79 (BP Location: Right Arm)   Pulse 71   Temp 98.6 F (37 C) (Oral)   Resp 18   SpO2 97%  Visual Acuity Right Eye Distance:   Left Eye Distance:   Bilateral Distance:    Right Eye Near:   Left Eye Near:    Bilateral Near:     Physical Exam Vitals and nursing note reviewed.  Constitutional:      Appearance: She is not ill-appearing.  HENT:     Head: Normocephalic and atraumatic.     Right Ear: Tympanic membrane and ear canal normal.     Left Ear: Tympanic membrane and ear canal normal.     Nose: No rhinorrhea.     Mouth/Throat:     Mouth: Mucous membranes are moist. Oral lesions (Several small lesions noted tonsillar pillars) present.     Pharynx: Uvula midline. Posterior oropharyngeal erythema present. No pharyngeal swelling.     Tonsils: No tonsillar exudate.  Eyes:     Conjunctiva/sclera: Conjunctivae normal.  Cardiovascular:     Rate and Rhythm: Normal rate and regular rhythm.  Pulmonary:     Effort: Pulmonary effort is normal.     Breath sounds: Normal breath sounds.  Skin:    General: Skin is warm and dry.  Neurological:     Mental Status: She is alert and oriented to person, place, and time.      UC Treatments / Results  Labs (all labs ordered are listed, but only abnormal results are displayed) Labs Reviewed - No data to display  EKG   Radiology No results found.  Procedures Procedures (including critical care time)  Medications Ordered in UC Medications - No data to display  Initial Impression / Assessment and Plan / UC Course  I have reviewed the triage vital signs and the nursing notes.  Pertinent labs & imaging results that were available during my care of the patient were reviewed by me and considered in my medical decision making (see chart for details).     42 year old with recent strep exposure presents with sore throat for several days, has erythema on  exam, point-of-care strep is positive.  Rx sent to pharmacy, home care and follow-up reviewed with patient   Final Clinical Impressions(s) / UC Diagnoses   Final diagnoses:  Strep pharyngitis   Discharge Instructions   None    ED Prescriptions     Medication Sig Dispense Auth. Provider   amoxicillin (AMOXIL) 875 MG tablet Take 1 tablet (875 mg total) by mouth 2 (two) times daily for 10 days. 20 tablet Meliton Rattan, [place]      PDMP not reviewed this encounter.   Meliton Rattan, [place] 03/31/23 [phone]

## 2023-04-02 ENCOUNTER — Other Ambulatory Visit (HOSPITAL_COMMUNITY): Payer: Self-pay

## 2023-04-02 DIAGNOSIS — E538 Deficiency of other specified B group vitamins: Secondary | ICD-10-CM | POA: Diagnosis not present

## 2023-04-02 DIAGNOSIS — E559 Vitamin D deficiency, unspecified: Secondary | ICD-10-CM | POA: Diagnosis not present

## 2023-04-02 DIAGNOSIS — N959 Unspecified menopausal and perimenopausal disorder: Secondary | ICD-10-CM | POA: Diagnosis not present

## 2023-04-02 DIAGNOSIS — E039 Hypothyroidism, unspecified: Secondary | ICD-10-CM | POA: Diagnosis not present

## 2023-04-02 DIAGNOSIS — B488 Other specified mycoses: Secondary | ICD-10-CM | POA: Diagnosis not present

## 2023-04-02 MED ORDER — PRAZIQUANTEL 600 MG PO TABS
1200.0000 mg | ORAL_TABLET | ORAL | 0 refills | Status: DC
  Filled 2023-04-02: qty 4, 30d supply, fill #0

## 2023-04-02 MED ORDER — PROGESTERONE MICRONIZED 100 MG PO CAPS
100.0000 mg | ORAL_CAPSULE | Freq: Every evening | ORAL | 1 refills | Status: DC
  Filled 2023-04-02: qty 90, 90d supply, fill #0

## 2023-04-02 MED ORDER — TINIDAZOLE 500 MG PO TABS
1000.0000 mg | ORAL_TABLET | Freq: Two times a day (BID) | ORAL | 0 refills | Status: DC
  Filled 2023-04-02: qty 12, 3d supply, fill #0

## 2023-04-07 ENCOUNTER — Other Ambulatory Visit (HOSPITAL_BASED_OUTPATIENT_CLINIC_OR_DEPARTMENT_OTHER): Payer: Self-pay

## 2023-04-15 ENCOUNTER — Other Ambulatory Visit (HOSPITAL_BASED_OUTPATIENT_CLINIC_OR_DEPARTMENT_OTHER): Payer: Self-pay

## 2023-04-22 ENCOUNTER — Ambulatory Visit: Payer: 59 | Admitting: Allergy

## 2023-05-06 ENCOUNTER — Other Ambulatory Visit (HOSPITAL_COMMUNITY): Payer: Self-pay

## 2023-05-06 MED ORDER — ITRACONAZOLE 100 MG PO CAPS
100.0000 mg | ORAL_CAPSULE | Freq: Two times a day (BID) | ORAL | 0 refills | Status: DC
  Filled 2023-05-06: qty 180, 90d supply, fill #0

## 2023-05-07 ENCOUNTER — Other Ambulatory Visit (HOSPITAL_COMMUNITY): Payer: Self-pay

## 2023-05-07 ENCOUNTER — Ambulatory Visit (INDEPENDENT_AMBULATORY_CARE_PROVIDER_SITE_OTHER): Payer: 59

## 2023-05-29 DIAGNOSIS — E559 Vitamin D deficiency, unspecified: Secondary | ICD-10-CM | POA: Diagnosis not present

## 2023-05-29 DIAGNOSIS — E538 Deficiency of other specified B group vitamins: Secondary | ICD-10-CM | POA: Diagnosis not present

## 2023-05-29 DIAGNOSIS — E039 Hypothyroidism, unspecified: Secondary | ICD-10-CM | POA: Diagnosis not present

## 2023-05-29 DIAGNOSIS — N959 Unspecified menopausal and perimenopausal disorder: Secondary | ICD-10-CM | POA: Diagnosis not present

## 2023-05-29 DIAGNOSIS — B488 Other specified mycoses: Secondary | ICD-10-CM | POA: Diagnosis not present

## 2023-06-03 ENCOUNTER — Other Ambulatory Visit (HOSPITAL_COMMUNITY)
Admission: AD | Admit: 2023-06-03 | Discharge: 2023-06-03 | Disposition: A | Discharge status: Home / Self Care | Payer: 59 | Source: Ambulatory Visit | Attending: *Deleted | Admitting: *Deleted

## 2023-06-04 DIAGNOSIS — A6920 Lyme disease, unspecified: Secondary | ICD-10-CM | POA: Diagnosis not present

## 2023-06-04 DIAGNOSIS — Z8616 Personal history of COVID-19: Secondary | ICD-10-CM | POA: Diagnosis not present

## 2023-06-04 DIAGNOSIS — E559 Vitamin D deficiency, unspecified: Secondary | ICD-10-CM | POA: Diagnosis not present

## 2023-06-04 DIAGNOSIS — B488 Other specified mycoses: Secondary | ICD-10-CM | POA: Diagnosis not present

## 2023-06-04 DIAGNOSIS — N959 Unspecified menopausal and perimenopausal disorder: Secondary | ICD-10-CM | POA: Diagnosis not present

## 2023-06-04 DIAGNOSIS — E538 Deficiency of other specified B group vitamins: Secondary | ICD-10-CM | POA: Diagnosis not present

## 2023-06-04 DIAGNOSIS — R5383 Other fatigue: Secondary | ICD-10-CM | POA: Diagnosis not present

## 2023-06-04 DIAGNOSIS — E039 Hypothyroidism, unspecified: Secondary | ICD-10-CM | POA: Diagnosis not present

## 2023-06-13 ENCOUNTER — Other Ambulatory Visit (HOSPITAL_BASED_OUTPATIENT_CLINIC_OR_DEPARTMENT_OTHER): Payer: Self-pay

## 2023-06-13 DIAGNOSIS — R053 Chronic cough: Secondary | ICD-10-CM | POA: Diagnosis not present

## 2023-06-13 DIAGNOSIS — U099 Post covid-19 condition, unspecified: Secondary | ICD-10-CM | POA: Diagnosis not present

## 2023-06-30 ENCOUNTER — Other Ambulatory Visit: Payer: Self-pay | Admitting: Obstetrics and Gynecology

## 2023-06-30 DIAGNOSIS — Z1231 Encounter for screening mammogram for malignant neoplasm of breast: Secondary | ICD-10-CM

## 2023-07-16 ENCOUNTER — Other Ambulatory Visit: Payer: Self-pay

## 2023-07-18 ENCOUNTER — Other Ambulatory Visit (HOSPITAL_BASED_OUTPATIENT_CLINIC_OR_DEPARTMENT_OTHER): Payer: Self-pay

## 2023-07-18 ENCOUNTER — Other Ambulatory Visit: Payer: Self-pay

## 2023-07-18 ENCOUNTER — Other Ambulatory Visit (HOSPITAL_COMMUNITY): Payer: Self-pay

## 2023-07-18 MED ORDER — ARNUITY ELLIPTA 200 MCG/ACT IN AEPB
1.0000 | INHALATION_SPRAY | Freq: Every day | RESPIRATORY_TRACT | 0 refills | Status: DC
  Filled 2023-07-18 – 2023-07-21 (×2): qty 30, 30d supply, fill #0

## 2023-07-21 ENCOUNTER — Other Ambulatory Visit (HOSPITAL_COMMUNITY): Payer: Self-pay

## 2023-07-21 ENCOUNTER — Other Ambulatory Visit (HOSPITAL_BASED_OUTPATIENT_CLINIC_OR_DEPARTMENT_OTHER): Payer: Self-pay

## 2023-07-30 DIAGNOSIS — R5383 Other fatigue: Secondary | ICD-10-CM | POA: Diagnosis not present

## 2023-07-30 DIAGNOSIS — B488 Other specified mycoses: Secondary | ICD-10-CM | POA: Diagnosis not present

## 2023-07-30 DIAGNOSIS — E617 Deficiency of multiple nutrient elements: Secondary | ICD-10-CM | POA: Diagnosis not present

## 2023-07-30 DIAGNOSIS — E039 Hypothyroidism, unspecified: Secondary | ICD-10-CM | POA: Diagnosis not present

## 2023-07-30 DIAGNOSIS — A6920 Lyme disease, unspecified: Secondary | ICD-10-CM | POA: Diagnosis not present

## 2023-07-30 DIAGNOSIS — E538 Deficiency of other specified B group vitamins: Secondary | ICD-10-CM | POA: Diagnosis not present

## 2023-07-30 DIAGNOSIS — N959 Unspecified menopausal and perimenopausal disorder: Secondary | ICD-10-CM | POA: Diagnosis not present

## 2023-07-30 DIAGNOSIS — Z8616 Personal history of COVID-19: Secondary | ICD-10-CM | POA: Diagnosis not present

## 2023-07-30 DIAGNOSIS — E559 Vitamin D deficiency, unspecified: Secondary | ICD-10-CM | POA: Diagnosis not present

## 2023-08-07 ENCOUNTER — Ambulatory Visit
Admission: RE | Admit: 2023-08-07 | Discharge: 2023-08-07 | Disposition: A | Discharge status: Home / Self Care | Payer: 59 | Source: Ambulatory Visit | Attending: Obstetrics and Gynecology | Admitting: Obstetrics and Gynecology

## 2023-08-07 DIAGNOSIS — Z1231 Encounter for screening mammogram for malignant neoplasm of breast: Secondary | ICD-10-CM

## 2023-08-27 DIAGNOSIS — Z6824 Body mass index (BMI) 24.0-24.9, adult: Secondary | ICD-10-CM | POA: Diagnosis not present

## 2023-08-27 DIAGNOSIS — Z3040 Encounter for surveillance of contraceptives, unspecified: Secondary | ICD-10-CM | POA: Diagnosis not present

## 2023-08-27 DIAGNOSIS — Z1231 Encounter for screening mammogram for malignant neoplasm of breast: Secondary | ICD-10-CM | POA: Diagnosis not present

## 2023-08-27 DIAGNOSIS — Z01419 Encounter for gynecological examination (general) (routine) without abnormal findings: Secondary | ICD-10-CM | POA: Diagnosis not present

## 2023-08-27 DIAGNOSIS — Z1330 Encounter for screening examination for mental health and behavioral disorders, unspecified: Secondary | ICD-10-CM | POA: Diagnosis not present

## 2023-08-27 DIAGNOSIS — N816 Rectocele: Secondary | ICD-10-CM | POA: Diagnosis not present

## 2023-08-28 ENCOUNTER — Emergency Department (HOSPITAL_COMMUNITY)
Admission: EM | Admit: 2023-08-28 | Discharge: 2023-08-28 | Disposition: A | Discharge status: Home / Self Care | Attending: Emergency Medicine | Admitting: Emergency Medicine

## 2023-08-28 ENCOUNTER — Emergency Department (HOSPITAL_COMMUNITY)

## 2023-08-28 ENCOUNTER — Other Ambulatory Visit: Payer: Self-pay

## 2023-08-28 ENCOUNTER — Other Ambulatory Visit (HOSPITAL_BASED_OUTPATIENT_CLINIC_OR_DEPARTMENT_OTHER): Payer: Self-pay

## 2023-08-28 DIAGNOSIS — S025XXA Fracture of tooth (traumatic), initial encounter for closed fracture: Secondary | ICD-10-CM

## 2023-08-28 DIAGNOSIS — R55 Syncope and collapse: Secondary | ICD-10-CM | POA: Insufficient documentation

## 2023-08-28 DIAGNOSIS — J329 Chronic sinusitis, unspecified: Secondary | ICD-10-CM | POA: Diagnosis not present

## 2023-08-28 DIAGNOSIS — R002 Palpitations: Secondary | ICD-10-CM | POA: Insufficient documentation

## 2023-08-28 DIAGNOSIS — S0993XA Unspecified injury of face, initial encounter: Secondary | ICD-10-CM | POA: Diagnosis not present

## 2023-08-28 DIAGNOSIS — J349 Unspecified disorder of nose and nasal sinuses: Secondary | ICD-10-CM

## 2023-08-28 LAB — URINALYSIS, ROUTINE W REFLEX MICROSCOPIC
Bilirubin Urine: NEGATIVE
Glucose, UA: NEGATIVE mg/dL
Ketones, ur: NEGATIVE mg/dL
Leukocytes,Ua: NEGATIVE
Nitrite: NEGATIVE
Protein, ur: 30 mg/dL — AB
RBC / HPF: >50 RBC/hpf (ref 0–5)
Specific Gravity, Urine: 1.004 — ABNORMAL LOW (ref 1.005–1.030)
pH: 6 (ref 5.0–8.0)

## 2023-08-28 LAB — CBC
HCT: 42.9 % (ref 36.0–46.0)
Hemoglobin: 14 g/dL (ref 12.0–15.0)
MCH: 28.7 pg (ref 26.0–34.0)
MCHC: 32.6 g/dL (ref 30.0–36.0)
MCV: 88.1 fL (ref 80.0–100.0)
Platelets: 199 10*3/uL (ref 150–400)
RBC: 4.87 MIL/uL (ref 3.87–5.11)
RDW: 13 % (ref 11.5–15.5)
WBC: 9.1 10*3/uL (ref 4.0–10.5)
nRBC: 0 % (ref 0.0–0.2)

## 2023-08-28 LAB — BASIC METABOLIC PANEL WITH GFR
Anion gap: 9 (ref 5–15)
BUN: 20 mg/dL (ref 6–20)
CO2: 24 mmol/L (ref 22–32)
Calcium: 9.3 mg/dL (ref 8.9–10.3)
Chloride: 102 mmol/L (ref 98–111)
Creatinine, Ser: 0.68 mg/dL (ref 0.44–1.00)
GFR, Estimated: >60 mL/min (ref >60)
Glucose, Bld: 119 mg/dL — ABNORMAL HIGH (ref 70–99)
Potassium: 3.7 mmol/L (ref 3.5–5.1)
Sodium: 135 mmol/L (ref 135–145)

## 2023-08-28 LAB — HCG, SERUM, QUALITATIVE: Preg, Serum: NEGATIVE

## 2023-08-28 LAB — CBG MONITORING, ED: Glucose-Capillary: 109 mg/dL — ABNORMAL HIGH (ref 70–99)

## 2023-08-28 MED ORDER — SODIUM CHLORIDE 0.9 % IV BOLUS: 1000.0000 mL | Freq: Once | INTRAVENOUS | Status: DC

## 2023-08-28 MED ORDER — AMOXICILLIN-POT CLAVULANATE 875-125 MG PO TABS
1.0000 | ORAL_TABLET | Freq: Two times a day (BID) | ORAL | 0 refills | Status: DC
  Filled 2023-08-28: qty 12, 6d supply, fill #0
  Filled 2023-08-28: qty 2, 1d supply, fill #0

## 2023-08-28 NOTE — ED Triage Notes (Addendum)
 Pt. Stated, Last night I got up from my girls bed and  went downstairs and felt my  heart fluttering. My phone said it was in A-Fib.Marland Kitchen and this morning I fell due to dizziness and black. I did this twice. I dont think I had any LOC. I went to have a bowel Movement and had another episode when pulling up my pants. I hit my knee and my mouth and chipped my tooth. No chest pain. I had an episode like this in Feb. And I hydrated myself and I felt better.

## 2023-08-28 NOTE — Discharge Instructions (Addendum)
 You have been evaluated for your symptoms.  Your EKG did not show any concerning cardiac rhythm.  Your blood work are otherwise normal.  CT scan did not show any broken bone or blood in your brain however it does show evidence of para sinus disease.  Take antibiotic as prescribed.  Call and follow-up closely with cardiology team for outpatient evaluation of your passing out episode.  You have chipped your tooth.  Follow up with your dentist for further care

## 2023-08-28 NOTE — ED Provider Notes (Signed)
 Ann Roberts Provider Note   CSN: [id number] Arrival date & time: 08/28/23  1017     History  Chief Complaint  Patient presents with   Dizziness   Near Syncope    Ann Roberts is a 43 y.o. female.  The history is provided by the patient and medical records. No language interpreter was used.  Dizziness Near Syncope     Ann Roberts is a 43 yo female with no significant PMH presenting to the ED today after experiencing palpitations and two episodes of near syncope/syncope. Pt states that earlier this morning, she experienced an episode of palpitations which was recorded on her smartwatch as afib. Shortly after this occurred, while standing, she experienced lightheadedness, blurry vision and she felt as if she was "going to black out". She states that she was able to lower herself to the ground and denies LOC. The next episode occurred approx 1 hour later. After the pt had a BM, she began to stand up to pull up her pants when she completely lost consciousness and fell forward hitting her chin on the floor and chipping a tooth. She denies any CP, SOB, fever, chills, double vision, N/V/D but she does endorse a minor headache. Pt also states that she had a similar episode of "afib" that was recorded on her watch back in 06/2023 after she started the carnivore diet but she has not followed up with her PCP or cardiology for this.   Home Medications Prior to Admission medications   Medication Sig Start Date End Date Taking? Authorizing Provider  albuterol (VENTOLIN HFA) 108 (90 Base) MCG/ACT inhaler Inhale 1 puff into the lungs every 4 (four) hours as needed. 06/24/22   [provider]  Albuterol-Budesonide (AIRSUPRA) 90-80 MCG/ACT AERO Inhale 2 puffs into the lungs every 4 (four) hours as needed. 12/31/22   Marcelyn Bruins, MD  Ascorbic Acid (VITAMIN C WITH ROSE HIPS) 500 MG tablet Take 500 mg by mouth daily.    [provider]  b complex vitamins capsule Take 1 capsule by mouth daily.    [provider]  calcium elemental as carbonate (BARIATRIC TUMS ULTRA) 400 MG chewable tablet Chew by mouth. Patient not taking: Reported on 03/05/2023    [provider]  Cholecalciferol (VITAMIN D) 50 MCG (2000 UT) tablet Take 2,000 Units by mouth daily.    [provider]  Fluticasone Furoate (ARNUITY ELLIPTA) 200 MCG/ACT AEPB Inhale 1 puff into the lungs daily. Patient not taking: Reported on 03/05/2023 06/26/22   Lupita Leash, MD  Fluticasone Furoate (ARNUITY ELLIPTA) 200 MCG/ACT AEPB Inhale 1 Inhalation into the lungs daily. 07/18/23     GNP BUDESONIDE NASAL SPRAY NA Place into the nose. Patient not taking: Reported on 03/05/2023    [provider]  itraconazole (SPORANOX) 100 MG capsule Take 1 capsule (100 mg total) by mouth 2 (two) times daily. (May start with taking 1 capsule 1 time daily for days 1-14). 05/06/23     Multiple Vitamins-Minerals (MULTIVIT/MULTIMINERAL ADULT PO) Take by mouth.    [provider]  praziquantel (BILTRICIDE) 600 MG tablet Take 2 tablets (1,200 mg total) by mouth every 30 (thirty) days. take the tinidazole in between these two doses 04/02/23     Probiotic Product (PROBIOTIC-10) CHEW Chew by mouth.    [provider]  progesterone (PROMETRIUM) 100 MG capsule Take 1 capsule (100 mg total) by mouth every evening. 04/02/23     tinidazole (TINDAMAX) 500  MG tablet Take 2 tablets (1,000 mg total) by mouth 2 (two) times daily, for 3 days 04/02/23         Allergies    Nsaids    Review of Systems   Review of Systems  Cardiovascular:  Positive for near-syncope.  Neurological:  Positive for dizziness.  All other systems reviewed and are negative.   Physical Exam Updated Vital Signs BP (!) 137/94 (BP Location: Left Arm)   Pulse 73   Temp 98.5 F (36.9 C)   Resp 16   Ht 5\' 7"  (1.702 m)   Wt 65.8 kg   LMP 08/27/2023   SpO2 100%   BMI  22.71 kg/m  Physical Exam Vitals and nursing note reviewed.  Constitutional:      General: She is not in acute distress.    Appearance: She is well-developed.  HENT:     Head: Normocephalic.     Comments: No scalp tenderness, no battle sign or racoon's eyes    Nose: Nose normal.     Mouth/Throat:     Comments: Small skin tear noted to mid upper gum line.  Small crack noted to R upper central incisor, no dental intrusion or extrusion.  No malocclusion Eyes:     Extraocular Movements: Extraocular movements intact.     Conjunctiva/sclera: Conjunctivae normal.     Pupils: Pupils are equal, round, and reactive to light.  Cardiovascular:     Rate and Rhythm: Normal rate and regular rhythm.     Pulses: Normal pulses.     Heart sounds: Normal heart sounds.  Pulmonary:     Effort: Pulmonary effort is normal.  Abdominal:     Palpations: Abdomen is soft.     Tenderness: There is no abdominal tenderness.  Musculoskeletal:        General: Normal range of motion.     Cervical back: Normal range of motion and neck supple.  Skin:    Findings: No rash.  Neurological:     Mental Status: She is alert and oriented to person, place, and time.  Psychiatric:        Mood and Affect: Mood normal.     ED Results / Procedures / Treatments   Labs (all labs ordered are listed, but only abnormal results are displayed) Labs Reviewed  BASIC METABOLIC PANEL WITH GFR - Abnormal; Notable for the following components:      Result Value   Glucose, Bld 119 (*)    All other components within normal limits  URINALYSIS, ROUTINE W REFLEX MICROSCOPIC - Abnormal; Notable for the following components:   APPearance HAZY (*)    Specific Gravity, Urine 1.004 (*)    Hgb urine dipstick LARGE (*)    Protein, ur 30 (*)    Bacteria, UA MANY (*)    All other components within normal limits  CBG MONITORING, ED - Abnormal; Notable for the following components:   Glucose-Capillary 109 (*)    All other components  within normal limits  CBC  HCG, SERUM, QUALITATIVE  CBG MONITORING, ED    EKG EKG Interpretation Date/Time:  Thursday August 28 2023 10:24:28 EDT Ventricular Rate:  76 PR Interval:  134 QRS Duration:  84 QT Interval:  372 QTC Calculation: 418 R Axis:   88  Text Interpretation: Normal sinus rhythm Right atrial enlargement Nonspecific T wave abnormality No previous ECGs available Confirmed by Gwyneth Sprout ([id number]) on 08/28/2023 10:29:05 AM  Radiology CT Head Wo Contrast Result Date: 08/28/2023 CLINICAL DATA:  Syncope/presyncope, cerebrovascular  cause suspected; Facial trauma, blunt EXAM: CT HEAD WITHOUT CONTRAST CT MAXILLOFACIAL WITHOUT CONTRAST TECHNIQUE: Multidetector CT imaging of the head and maxillofacial structures were performed using the standard protocol without intravenous contrast. Multiplanar CT image reconstructions of the maxillofacial structures were also generated. RADIATION DOSE REDUCTION: This exam was performed according to the departmental dose-optimization program which includes automated exposure control, adjustment of the mA and/or kV according to patient size and/or use of iterative reconstruction technique. COMPARISON:  None Available. FINDINGS: CT HEAD FINDINGS Brain: No evidence of acute infarction, hemorrhage, hydrocephalus, extra-axial collection or mass lesion/mass effect. Vascular: No hyperdense vessel or unexpected calcification. Skull: Normal. Negative for fracture or focal lesion. Other: None. CT MAXILLOFACIAL FINDINGS Osseous: No fracture or mandibular dislocation. No destructive process. Orbits: Negative. No traumatic or inflammatory finding. Sinuses: There is complete opacification of bilateral ethmoidal air cells and left chamber of sphenoid sinus. There is near complete opacification of bilateral frontal sinus, bilateral maxillary sinus and right chamber of sphenoid sinus. There are changes from prior bilateral maxillary antrectomies. Soft tissues: Negative.  IMPRESSION: 1. No acute intracranial abnormality. 2. No acute facial bone fracture. 3. Extensive paranasal sinusitis. Electronically Signed   By: Jules Schick M.D.   On: 08/28/2023 14:54   CT Maxillofacial Wo Contrast Result Date: 08/28/2023 CLINICAL DATA:  Syncope/presyncope, cerebrovascular cause suspected; Facial trauma, blunt EXAM: CT HEAD WITHOUT CONTRAST CT MAXILLOFACIAL WITHOUT CONTRAST TECHNIQUE: Multidetector CT imaging of the head and maxillofacial structures were performed using the standard protocol without intravenous contrast. Multiplanar CT image reconstructions of the maxillofacial structures were also generated. RADIATION DOSE REDUCTION: This exam was performed according to the departmental dose-optimization program which includes automated exposure control, adjustment of the mA and/or kV according to patient size and/or use of iterative reconstruction technique. COMPARISON:  None Available. FINDINGS: CT HEAD FINDINGS Brain: No evidence of acute infarction, hemorrhage, hydrocephalus, extra-axial collection or mass lesion/mass effect. Vascular: No hyperdense vessel or unexpected calcification. Skull: Normal. Negative for fracture or focal lesion. Other: None. CT MAXILLOFACIAL FINDINGS Osseous: No fracture or mandibular dislocation. No destructive process. Orbits: Negative. No traumatic or inflammatory finding. Sinuses: There is complete opacification of bilateral ethmoidal air cells and left chamber of sphenoid sinus. There is near complete opacification of bilateral frontal sinus, bilateral maxillary sinus and right chamber of sphenoid sinus. There are changes from prior bilateral maxillary antrectomies. Soft tissues: Negative. IMPRESSION: 1. No acute intracranial abnormality. 2. No acute facial bone fracture. 3. Extensive paranasal sinusitis. Electronically Signed   By: Jules Schick M.D.   On: 08/28/2023 14:54    Procedures Procedures    Medications Ordered in ED Medications  sodium  chloride 0.9 % bolus 1,000 mL (has no administration in time range)    ED Course/ Medical Decision Making/ A&P                                 Medical Decision Making Amount and/or Complexity of Data Reviewed Labs: ordered. Radiology: ordered.  Risk Prescription drug management.   BP (!) 137/94 (BP Location: Left Arm)   Pulse 73   Temp 98.5 F (36.9 C)   Resp 16   Ht 5\' 7"  (1.702 m)   Wt 65.8 kg   LMP 08/27/2023   SpO2 100%   BMI 22.71 kg/m   1:38 PM   Ann Roberts is a 43 yo female with no significant PMH presenting to the ED today after experiencing palpitations and two episodes  of near syncope/syncope. Pt states that earlier this morning, she experienced an episode of palpitations which was recorded on her smartwatch as afib. Shortly after this occurred, while standing, she experienced lightheadedness, blurry vision and she felt as if she was "going to black out". She states that she was able to lower herself to the ground and denies LOC. The next episode occurred approx 1 hour later. After the pt had a BM, she began to stand up to pull up her pants when she completely lost consciousness and fell forward hitting her chin on the floor and chipping a tooth. She denies any CP, SOB, fever, chills, double vision, N/V/D but she does endorse a minor headache. Pt also states that she had a similar episode of "afib" that was recorded on her watch back in 06/2023 after she started the carnivore diet but she has not followed up with her PCP or cardiology for this.   On exam, patient is resting comfortably appears to be in no acute discomfort.  Exam notable for a small chip on her right upper central incisor and a small bruising and skin tear noted to her mid upper gumline.  No malocclusion.  She is mentating appropriately.  No other signs of injury noted.  Mild tenderness to anterior knees bilaterally.  Vital sign overall reassuring.  -Labs ordered, independently viewed and interpreted by me.   Labs remarkable for UA with large Hgb and many WBC.  Pt currently on her menstruation and she denies urinary sxs.   -The patient was maintained on a cardiac monitor.  I personally viewed and interpreted the cardiac monitored which showed an underlying rhythm of: NSR -Imaging independently viewed and interpreted by me and I agree with radiologist's interpretation.  Result remarkable for head and maxillofacial CT without acute finding -This patient presents to the ED for concern of syncope, this involves an extensive number of treatment options, and is a complaint that carries with it a high risk of complications and morbidity.  The differential diagnosis includes cardiac arrhythmia, anemia, electrolytes derangement, stroke, mi -Co morbidities that complicate the patient evaluation includes none -Treatment includes fluid -Reevaluation of the patient after these medicines showed that the patient improved -PCP office notes or outside notes reviewed -Escalation to admission/observation considered: patients feels much better, is comfortable with discharge, and will follow up with cardiology -Prescription medication considered, patient comfortable with otc meds -Social Determinant of Health considered   Patient mention that her smart watch indicates she has several episodes of atrial fibrillation.  Today EKG showed normal sinus rhythm.  She is back to her baseline.  She does not have any other significant risk factor required hospitalization for syncope.  She would likely benefit from outpatient follow-up with cardiology for evaluation of her symptoms.  Likely benefit from Holter monitoring.  Return precaution was given. Will also discharge home with Augmentin to treat for parasinus disease.        Final Clinical Impression(s) / ED Diagnoses Final diagnoses:  Syncope and collapse  Sinus disease    Rx / DC Orders ED Discharge Orders          Ordered    Ambulatory referral to Cardiology        Comments: If you have not heard from the Cardiology office within the next 72 hours please call [phone].   08/28/23 1446    amoxicillin-clavulanate (AUGMENTIN) 875-125 MG tablet  Every 12 hours        08/28/23 1502  Fayrene Helper, PA-C 08/28/23 1505    Wynetta Fines, MD 08/28/23 [phone]

## 2023-09-01 ENCOUNTER — Other Ambulatory Visit (HOSPITAL_BASED_OUTPATIENT_CLINIC_OR_DEPARTMENT_OTHER): Payer: Self-pay

## 2023-09-01 MED ORDER — ARNUITY ELLIPTA 200 MCG/ACT IN AEPB
1.0000 | INHALATION_SPRAY | Freq: Every day | RESPIRATORY_TRACT | 12 refills | Status: DC
  Filled 2023-09-01: qty 30, 30d supply, fill #0
  Filled 2024-02-23: qty 30, 30d supply, fill #1
  Filled 2024-03-25: qty 30, 30d supply, fill #2
  Filled 2024-04-28: qty 30, 30d supply, fill #3

## 2023-09-25 ENCOUNTER — Ambulatory Visit: Attending: Internal Medicine | Admitting: Internal Medicine

## 2023-09-25 ENCOUNTER — Encounter: Payer: Self-pay | Admitting: Internal Medicine

## 2023-09-25 ENCOUNTER — Encounter: Payer: Self-pay | Admitting: *Deleted

## 2023-09-25 VITALS — BP 129/79 | HR 56 | Ht 67.5 in | Wt 149.0 lb

## 2023-09-25 DIAGNOSIS — R002 Palpitations: Secondary | ICD-10-CM | POA: Diagnosis not present

## 2023-09-25 DIAGNOSIS — R55 Syncope and collapse: Secondary | ICD-10-CM | POA: Diagnosis not present

## 2023-09-25 DIAGNOSIS — R001 Bradycardia, unspecified: Secondary | ICD-10-CM | POA: Diagnosis not present

## 2023-09-25 NOTE — Progress Notes (Unsigned)
Patient enrolled for [employer]/ [employer] to ship a 30 day cardiac event monitor to her address on file. 

## 2023-09-25 NOTE — Progress Notes (Signed)
 Cardiology Office Note:  .    Date:  09/25/2023  ID:  Marilynn Shuck, DOB 1980/06/27, MRN [id number] PCP: Adrian Hopper, MD  [facility] Providers Cardiologist:  Jann Melody, MD     CC: Query of AF Consulted for the evaluation of near sycope at the behest of Dr. Ileana Mallard   History of Present Illness: .    Ann Roberts is a 43 y.o. female who presents with palpitations and near syncope.  She experienced an episode of syncope on August 28, 2023, and a previous near-syncope episode on June 30, 2023. During the first episode, she felt her heart beating unusually after waking up in the middle of the night. The following morning, she became dizzy and lightheaded in the shower, with her blood pressure dropping to 79/?Aaron Aas She did not pass out but experienced significant visual disturbances.  On August 28, 2023, she experienced another episode of near syncope. She felt dizzy and lightheaded after getting up in the morning, and her husband noted a 'thunk' sound as she fell. She managed to get to the bed and consumed water and electrolytes. While using the bathroom, she experienced another episode, resulting in a fall that caused a cracked tooth. Her husband described her as 'cold sweating' during this event. She noted that her blood pressure was not as low as during the first episode.  Between these episodes, her smartwatch recorded an atrial fibrillation event, which she found concerning. She also noted fluctuations in her heart rate, with highs in the 150s and lows in the 30s, although these were short-lived. No heart fluttering in the past two weeks.  She started a carnivore diet in January 2025, primarily to address sinus and respiratory issues post-COVID. She speculated that dietary changes might have contributed to her symptoms, possibly due to dehydration or inadequate nutrition.   Relevant histories: .  Social  - Originally from [place]  - Works as a Teacher, early years/pre  at [employer] - Has young children at home - Father: heavy drinker, smoker, had CABG x3 or x4, hypertension, peripheral artery disease - Mother: passed away from a heart attack, no arrhythmias known  ROS: As per HPI.    Physical Exam:    VS:  BP 129/79 (BP Location: Right Arm)   Pulse (!) 56   Ht 5' 7.5" (1.715 m)   Wt 149 lb (67.6 kg)   LMP 08/27/2023   SpO2 96%   BMI 22.99 kg/m    Wt Readings from Last 3 Encounters:  09/25/23 149 lb (67.6 kg)  08/28/23 145 lb (65.8 kg)  03/05/23 155 lb (70.3 kg)    Gen: no distress  Neck: No JVD Cardiac: No Rubs or Gallops, no murmur, regular bradycardia, +2 radial pulses Respiratory: Clear to auscultation bilaterally, normal effort, normal  respiratory rate GI: Soft, nontender, non-distended  MS: No  edema;  moves all extremities Integument: Skin feels warm Neuro:  At time of evaluation, alert and oriented to person/place/time/situation  Psych: Normal affect, patient feels ok   ASSESSMENT AND PLAN: .    Palpitations Symptomatic supraventricular tachycardia vs AF Baseline sinus bradycardia Intermittent episodes of rapid heart rate with near syncope. Differential diagnosis includes atrial fibrillation, AV nodal reentrant tachycardia (AVNRT), and vasovagal syncope. Clinical suspicion leans towards symptomatic supraventricular tachycardia, possibly AVNRT. EKG shows sinus rhythm with no epsilon wave. Biatrial enlargement suspected but not confirmed. Echocardiogram and heart monitor planned for further assessment. Diltiazem prescribed as needed for palpitations, with the potential to abort AVNRT  episodes. Informed consent obtained for diltiazem, including discussion of its effects on heart rate and AV node, and potential side effects such as slowing of heart rate. - Order echocardiogram to assess cardiac structure. - Initiate [employer] monitor for long-term heart rhythm monitoring. - Prescribe diltiazem 30 mg PO PRN for palpitations, up to  every 6 hours (no standing; she has resting bradycardia) - Educate on the use of diltiazem and its effects on heart rate and AV node, including potential side effects such as slowing of heart rate. - Discuss differential diagnosis including atrial fibrillation, AVNRT, and vasovagal syncope.  Near syncope Episodes of near syncope associated with rapid heart rate. Occurred twice since starting a carnivore diet. Possible contributing factors include dehydration and dietary changes. Differential diagnosis includes vasovagal syncope, atrial fibrillation, and supraventricular tachycardia. Medical decision making includes consideration of dietary changes and hydration status as potential contributors to symptoms. - Encourage adequate hydration and balanced diet to prevent dehydration.  Will see me in three months then PRN if no issues  Gloriann Larger, MD FASE [facility] Cardiologist [facility]  [address] [place], [address] [place], [place] [address] [phone]  2:55 PM

## 2023-09-25 NOTE — Patient Instructions (Addendum)
 Medication Instructions:  Your physician has recommended you make the following change in your medication:  START: Diltiazem (Cardizem) 30 mg by mouth every 6 hours as needed for palpitations.  *If you need a refill on your cardiac medications before your next appointment, please call your pharmacy*  Lab Work: NONE  If you have labs (blood work) drawn today and your tests are completely normal, you will receive your results only by: MyChart Message (if you have MyChart) OR A paper copy in the mail If you have any lab test that is abnormal or we need to change your treatment, we will call you to review the results.  Testing/Procedures: Your physician has requested that you have an echocardiogram. Echocardiography is a painless test that uses sound waves to create images of your heart. It provides your doctor with information about the size and shape of your heart and how well your heart's chambers and valves are working. This procedure takes approximately one hour. There are no restrictions for this procedure. Please do NOT wear cologne, perfume, aftershave, or lotions (deodorant is allowed). Please arrive 15 minutes prior to your appointment time.  Please note: We ask at that you not bring children with you during ultrasound (echo/ vascular) testing. Due to room size and safety concerns, children are not allowed in the ultrasound rooms during exams. Our front office staff cannot provide observation of children in our lobby area while testing is being conducted. An adult accompanying a patient to their appointment will only be allowed in the ultrasound room at the discretion of the ultrasound technician under special circumstances. We apologize for any inconvenience.  Your physician has recommended that you wear an event monitor. Event monitors are medical devices that record the heart's electrical activity. Doctors most often us  these monitors to diagnose arrhythmias. Arrhythmias are problems  with the speed or rhythm of the heartbeat. The monitor is a small, portable device. You can wear one while you do your normal daily activities. This is usually used to diagnose what is causing palpitations/syncope (passing out).   Follow-Up: At [facility], you and your health needs are our priority.  As part of our continuing mission to provide you with exceptional heart care, our providers are all part of one team.  This team includes your primary Cardiologist (physician) and Advanced Practice Providers or APPs (Physician Assistants and Nurse Practitioners) who all work together to provide you with the care you need, when you need it.  Your next appointment:   3 month(s)  Provider:   Jann Melody, MD      Other Instructions [employer] Cardiac Event Monitor Instructions  Your physician has requested you wear your cardiac event monitor for 30 days, (1-30). [employer] may call or text to confirm a shipping address. The monitor will be sent to a land address via UPS. [employer] will not ship a monitor to a PO BOX. It typically takes 3-5 days to receive your monitor after it has been enrolled. [employer] will assist with USPS tracking if your package is delayed. The telephone number for [employer] is [phone]. Once you have received your monitor, please review the enclosed instructions. Instruction tutorials can also be viewed under help and settings on the enclosed cell phone. Your monitor has already been registered assigning a specific monitor serial # to you.  Billing and Self Pay Discount Information  [employer] has been provided the insurance information we had on file for you.  If your insurance has been updated, please call  [employer] at [phone] to provide them with your updated insurance information.   [employer] offers a discounted Self Pay option for patients who have insurance that does not cover their cardiac event monitor or patients without  insurance.  The discounted cost of a Self Pay Cardiac Event Monitor would be $225.00 , if the patient contacts [employer] at [phone] within 7 days of applying the monitor to make payment arrangements.  If the patient does not contact [employer] within 7 days of applying the monitor, the cost of the cardiac event monitor will be $350.00.  Applying the monitor  Remove cell phone from case and turn it on. The cell phone works as IT consultant and needs to be within [employer] of you at all times. The cell phone will need to be charged on a daily basis. We recommend you plug the cell phone into the enclosed charger at your bedside table every night.  Monitor batteries: You will receive two monitor batteries labelled #1 and #2. These are your recorders. Plug battery #2 onto the second connection on the enclosed charger. Keep one battery on the charger at all times. This will keep the monitor battery deactivated. It will also keep it fully charged for when you need to switch your monitor batteries. A small light will be blinking on the battery emblem when it is charging. The light on the battery emblem will remain on when the battery is fully charged.  Open package of a Monitor strip. Insert battery #1 into black hood on strip and gently squeeze monitor battery onto connection as indicated in instruction booklet. Set aside while preparing skin.  Choose location for your strip, vertical or horizontal, as indicated in the instruction booklet. Shave to remove all hair from location. There cannot be any lotions, oils, powders, or colognes on skin where monitor is to be applied. Wipe skin clean with enclosed Saline wipe. Dry skin completely.  Peel paper labeled #1 off the back of the Monitor strip exposing the adhesive. Place the monitor on the chest in the vertical or horizontal position shown in the instruction booklet. One arrow on the monitor strip must be pointing upward. Carefully remove paper  labeled #2, attaching remainder of strip to your skin. Try not to create any folds or wrinkles in the strip as you apply it.  Firmly press and release the circle in the center of the monitor battery. You will hear a small beep. This is turning the monitor battery on. The heart emblem on the monitor battery will light up every 5 seconds if the monitor battery in turned on and connected to the patient securely. Do not push and hold the circle down as this turns the monitor battery off. The cell phone will locate the monitor battery. A screen will appear on the cell phone checking the connection of your monitor strip. This may read poor connection initially but change to good connection within the next minute. Once your monitor accepts the connection you will hear a series of 3 beeps followed by a climbing crescendo of beeps. A screen will appear on the cell phone showing the two monitor strip placement options. Touch the picture that demonstrates where you applied the monitor strip.  Your monitor strip and battery are waterproof. You are able to shower, bathe, or swim with the monitor on. They just ask you do not submerge deeper than 3 feet underwater. We recommend removing the monitor if you are swimming in a lake, river, or ocean.  Your  monitor battery will need to be switched to a fully charged monitor battery approximately once a week. The cell phone will alert you of an action which needs to be made.  On the cell phone, tap for details to reveal connection status, monitor battery status, and cell phone battery status. The green dots indicates your monitor is in good status. A red dot indicates there is something that needs your attention.  To record a symptom, click the circle on the monitor battery. In 30-60 seconds a list of symptoms will appear on the cell phone. Select your symptom and tap save. Your monitor will record a sustained or significant arrhythmia regardless of you clicking  the button. Some patients do not feel the heart rhythm irregularities. [employer] will notify us  of any serious or critical events.  Refer to instruction booklet for instructions on switching batteries, changing strips, the Do not disturb or Pause features, or any additional questions.  Call [employer] at [phone], to confirm your monitor is transmitting and record your baseline. They will answer any questions you may have regarding the monitor instructions at that time.  Returning the monitor to [employer]  Place all equipment back into blue box. Peel off strip of paper to expose adhesive and close box securely. There is a prepaid UPS shipping label on this box. Drop in a UPS drop box, or at a UPS facility like [employer]. You may also contact [employer] to arrange UPS to pick up monitor package at your home.

## 2023-09-30 DIAGNOSIS — Z0000 Encounter for general adult medical examination without abnormal findings: Secondary | ICD-10-CM | POA: Diagnosis not present

## 2023-10-02 DIAGNOSIS — R002 Palpitations: Secondary | ICD-10-CM | POA: Diagnosis not present

## 2023-10-02 DIAGNOSIS — R55 Syncope and collapse: Secondary | ICD-10-CM | POA: Diagnosis not present

## 2023-10-07 ENCOUNTER — Other Ambulatory Visit (HOSPITAL_BASED_OUTPATIENT_CLINIC_OR_DEPARTMENT_OTHER): Payer: Self-pay

## 2023-10-07 MED ORDER — PROGESTERONE MICRONIZED 100 MG PO CAPS
100.0000 mg | ORAL_CAPSULE | Freq: Every evening | ORAL | 1 refills | Status: AC
  Filled 2023-10-07: qty 90, 90d supply, fill #0
  Filled 2024-05-11: qty 90, 90d supply, fill #1

## 2023-10-31 ENCOUNTER — Ambulatory Visit (HOSPITAL_COMMUNITY)
Admission: RE | Admit: 2023-10-31 | Discharge: 2023-10-31 | Disposition: A | Discharge status: Home / Self Care | Source: Ambulatory Visit | Attending: Cardiology | Admitting: Cardiology

## 2023-10-31 DIAGNOSIS — R55 Syncope and collapse: Secondary | ICD-10-CM

## 2023-10-31 DIAGNOSIS — R002 Palpitations: Secondary | ICD-10-CM | POA: Diagnosis not present

## 2023-10-31 LAB — ECHOCARDIOGRAM COMPLETE
Area-P 1/2: 3.39 cm2
S' Lateral: 3.2 cm

## 2023-11-01 ENCOUNTER — Ambulatory Visit: Payer: Self-pay | Admitting: Internal Medicine

## 2023-11-03 ENCOUNTER — Ambulatory Visit: Attending: Internal Medicine

## 2023-11-03 DIAGNOSIS — R002 Palpitations: Secondary | ICD-10-CM

## 2023-11-03 DIAGNOSIS — R55 Syncope and collapse: Secondary | ICD-10-CM | POA: Diagnosis not present

## 2023-12-03 ENCOUNTER — Other Ambulatory Visit (HOSPITAL_BASED_OUTPATIENT_CLINIC_OR_DEPARTMENT_OTHER): Payer: Self-pay

## 2023-12-03 DIAGNOSIS — E538 Deficiency of other specified B group vitamins: Secondary | ICD-10-CM | POA: Diagnosis not present

## 2023-12-03 DIAGNOSIS — E617 Deficiency of multiple nutrient elements: Secondary | ICD-10-CM | POA: Diagnosis not present

## 2023-12-03 DIAGNOSIS — E559 Vitamin D deficiency, unspecified: Secondary | ICD-10-CM | POA: Diagnosis not present

## 2023-12-03 DIAGNOSIS — B488 Other specified mycoses: Secondary | ICD-10-CM | POA: Diagnosis not present

## 2023-12-03 DIAGNOSIS — E039 Hypothyroidism, unspecified: Secondary | ICD-10-CM | POA: Diagnosis not present

## 2023-12-03 DIAGNOSIS — N959 Unspecified menopausal and perimenopausal disorder: Secondary | ICD-10-CM | POA: Diagnosis not present

## 2023-12-03 MED ORDER — VORICONAZOLE 200 MG PO TABS
ORAL_TABLET | ORAL | 1 refills | Status: AC
  Filled 2023-12-03: qty 60, 30d supply, fill #0
  Filled 2024-01-01 – 2024-01-05 (×2): qty 60, 30d supply, fill #1
  Filled 2024-02-02: qty 60, 30d supply, fill #2

## 2023-12-04 ENCOUNTER — Other Ambulatory Visit (HOSPITAL_BASED_OUTPATIENT_CLINIC_OR_DEPARTMENT_OTHER): Payer: Self-pay

## 2023-12-17 ENCOUNTER — Ambulatory Visit: Attending: Cardiology | Admitting: Internal Medicine

## 2023-12-17 ENCOUNTER — Encounter: Payer: Self-pay | Admitting: Internal Medicine

## 2023-12-17 VITALS — BP 120/78 | HR 72 | Ht 67.5 in | Wt 143.0 lb

## 2023-12-17 DIAGNOSIS — I4729 Other ventricular tachycardia: Secondary | ICD-10-CM | POA: Diagnosis not present

## 2023-12-17 DIAGNOSIS — I491 Atrial premature depolarization: Secondary | ICD-10-CM | POA: Diagnosis not present

## 2023-12-17 DIAGNOSIS — R55 Syncope and collapse: Secondary | ICD-10-CM

## 2023-12-17 NOTE — Progress Notes (Signed)
 Cardiology Office Note:  .    Date:  12/17/2023  ID:  Ann Roberts, DOB 09-23-1980, MRN [id number] PCP: Keren Vicenta BRAVO, MD  [facility] Providers Cardiologist:  Stanly DELENA Leavens, MD     CC: F/u cardiac testing.   History of Present Illness: .    Ann Roberts is a 43 y.o. female who presents with palpitations and near syncope.  Ann Roberts is a 43 year old female with a history of palpitations and near syncope who presents for follow-up after a syncopal episode.  She experienced a syncopal episode on August 28, 2023, during which she fell and cracked a tooth. Her smart watch recorded short atrial fibrillation during this time. She also had a near syncopal episode on June 30, 2023. She has a history of rare premature atrial contractions (PACs) and premature ventricular contractions (PVCs), and a resting bradycardia that is likely physiologic.  An echocardiogram showed normal strain and function with no evidence of hypertrophic cardiomyopathy. A heart monitor revealed a five-beat run of non-sustained ventricular tachycardia with a right bundle branch morphology.  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 She is currently being treated with oxiconazole for the sinusitis. She is trying to stay hydrated, drinking at least two 40-ounce bottles of water daily, sometimes more.   Relevant histories: .  Social  - Originally from [place]  - Works as a Teacher, early years/pre at [employer] - Has young children at home - Father: heavy drinker, smoker, had CABG x3 or x4, hypertension, peripheral artery disease - Mother: passed away from a heart attack, no arrhythmias known  ROS: As per HPI.  Cardiac Studies & Procedures   ______________________________________________________________________________________________     ECHOCARDIOGRAM  ECHOCARDIOGRAM  COMPLETE 10/31/2023  Narrative ECHOCARDIOGRAM REPORT    Patient Name:   Ann Roberts Date of Exam: 10/31/2023 Medical Rec #:  [id number]         Height:       67.5 in Accession #:    [id number]        Weight:       149.0 lb Date of Birth:  07/28/1980         BSA:          1.794 m Patient Age:    42 years          BP:           123/81 mmHg Patient Gender: F                 HR:           63 bpm. Exam Location:  [address]  Procedure: 2D Echo, 3D Echo, Cardiac Doppler, Color Doppler and Strain Analysis (Both Spectral and Color Flow Doppler were utilized during procedure).  Indications:    R55 Syncope  History:        Patient has no prior history of Echocardiogram examinations. Arrythmias:Bradycardia; Signs/Symptoms:Syncope and Palpitations.  Sonographer:    Nolon Vicenta [facility], RDCS Referring Phys: [id number] [facility] A Roque Schill  IMPRESSIONS   1. Left ventricular ejection fraction, by estimation, is 55 to 60%. The left ventricle has normal function. The left ventricle has no regional wall motion abnormalities. Left ventricular diastolic parameters were normal. The average left ventricular global longitudinal strain is -19.7 %. The global longitudinal strain is normal. 2. Right ventricular systolic function is normal. The right ventricular size is normal. Tricuspid regurgitation  signal is inadequate for assessing PA pressure. 3. The mitral valve is normal in structure. Trivial mitral valve regurgitation. No evidence of mitral stenosis. 4. The aortic valve is tricuspid. Aortic valve regurgitation is not visualized. No aortic stenosis is present. 5. The inferior vena cava is normal in size with greater than 50% respiratory variability, suggesting right atrial pressure of 3 mmHg.  FINDINGS Left Ventricle: Left ventricular ejection fraction, by estimation, is 55 to 60%. The left ventricle has normal function. The left ventricle has no regional wall motion abnormalities. The average  left ventricular global longitudinal strain is -19.7 %. Strain was performed and the global longitudinal strain is normal. The left ventricular internal cavity size was normal in size. There is no left ventricular hypertrophy. Left ventricular diastolic parameters were normal.  Right Ventricle: The right ventricular size is normal. No increase in right ventricular wall thickness. Right ventricular systolic function is normal. Tricuspid regurgitation signal is inadequate for assessing PA pressure.  Left Atrium: Left atrial size was normal in size.  Right Atrium: Right atrial size was normal in size.  Pericardium: There is no evidence of pericardial effusion.  Mitral Valve: The mitral valve is normal in structure. Trivial mitral valve regurgitation. No evidence of mitral valve stenosis.  Tricuspid Valve: The tricuspid valve is normal in structure. Tricuspid valve regurgitation is not demonstrated.  Aortic Valve: The aortic valve is tricuspid. Aortic valve regurgitation is not visualized. No aortic stenosis is present.  Pulmonic Valve: The pulmonic valve was normal in structure. Pulmonic valve regurgitation is not visualized.  Aorta: The aortic root is normal in size and structure.  Venous: The inferior vena cava is normal in size with greater than 50% respiratory variability, suggesting right atrial pressure of 3 mmHg.  IAS/Shunts: No atrial level shunt detected by color flow Doppler.   LEFT VENTRICLE PLAX 2D LVIDd:         4.50 cm   Diastology LVIDs:         3.20 cm   LV e' medial:    11.40 cm/s LV PW:         0.80 cm   LV E/e' medial:  7.1 LV IVS:        0.70 cm   LV e' lateral:   19.10 cm/s LVOT diam:     1.90 cm   LV E/e' lateral: 4.3 LV SV:         75 LV SV Index:   42        2D Longitudinal Strain LVOT Area:     2.84 cm  2D Strain GLS (A4C):   -20.6 % 2D Strain GLS (A3C):   -19.2 % 2D Strain GLS (A2C):   -19.2 % 2D Strain GLS Avg:     -19.7 %  3D Volume EF: 3D EF:         65 % LV EDV:       148 ml LV ESV:       52 ml LV SV:        96 ml  RIGHT VENTRICLE             IVC RV Basal diam:  3.30 cm     IVC diam: 1.90 cm RV Mid diam:    2.30 cm RV S prime:     14.30 cm/s TAPSE (M-mode): 2.8 cm  LEFT ATRIUM             Index        RIGHT ATRIUM  Index LA diam:        3.10 cm 1.73 cm/m   RA Area:     8.64 cm LA Vol (A2C):   37.6 ml 20.96 ml/m  RA Volume:   15.40 ml 8.58 ml/m LA Vol (A4C):   23.7 ml 13.21 ml/m LA Biplane Vol: 32.1 ml 17.89 ml/m AORTIC VALVE LVOT Vmax:   113.00 cm/s LVOT Vmean:  79.100 cm/s LVOT VTI:    0.263 m  AORTA Ao Root diam: 2.70 cm  MITRAL VALVE MV Area (PHT): 3.39 cm    SHUNTS MV Decel Time: 224 msec    Systemic VTI:  0.26 m MV E velocity: 81.20 cm/s  Systemic Diam: 1.90 cm MV A velocity: 73.50 cm/s MV E/A ratio:  1.10  Dalton McleanMD Electronically signed by Ezra Kanner Signature Date/Time: 10/31/2023/4:01:24 PM    Final    MONITORS  CARDIAC EVENT MONITOR 11/03/2023  Narrative   Patient had a minimum heart rate of 47 bpm, maximum heart rate of 159 bpm, and average heart rate of 66 bpm. Predominant underlying rhythm was sinus rhythm. One 5- secondary run of NSVT with rate of 132 bpm with RBBB morphology.  No symptoms. Isolated PACs were rare (<1.0%). Isolated PVCs were rare (<1.0%). Triggered and diary events associated with sinus rhythm.  No evidence of cardiac syncope event.       ______________________________________________________________________________________________       Physical Exam:    VS:  BP 120/78   Pulse 72   Ht 5' 7.5 (1.715 m)   Wt 143 lb (64.9 kg)   SpO2 99%   BMI 22.07 kg/m    Wt Readings from Last 3 Encounters:  12/17/23 143 lb (64.9 kg)  09/25/23 149 lb (67.6 kg)  08/28/23 145 lb (65.8 kg)    Gen: No distress  Neck: No JVD Cardiac: No Rubs or Gallops, no murmur, regular rhythm, +2 radial pulses Respiratory: Clear to auscultation bilaterally, normal  effort, normal  respiratory rate GI: Soft, nontender, non-distended  MS: No  edema;  moves all extremities Integument: Skin feels warm Neuro:  At time of evaluation, alert and oriented to person/place/time/situation  Psych: Normal affect, patient feels ok   ASSESSMENT AND PLAN: .    Vasovagal syncope with palpitations and nonsustained ventricular tachycardia - She experiences syncopal and near-syncopal episodes, with a recent episode causing a cracked tooth. Initial concerns for atrial fibrillation or atrial flutter were alleviated as monitoring showed only short atrial fibrillation episodes. Echocardiogram indicated normal heart function and absence of hypertrophic cardiomyopathy. Heart monitor revealed a 5-beat run of nonsustained ventricular tachycardia with right bundle branch morphology, asymptomatic. Likely vasovagal syncope is related to dietary changes and dehydration from the carnivore diet. No evidence of cardiac syncope in the context of AFib or AVNRT. Rare PACs and PVCs are present, with resting bradycardia likely physiologic. - Encourage hydration, aiming for 3 liters per day (Reviewed NIM recommendations)  - Consider using [employer] Mobile or similar device for heart monitoring (FitBIt Watch) if symptoms arise. - Monitor for any new cardiac symptoms and report if she occurs. - reviewed dietary options at length  Resting bradycardia with rare premature atrial and ventricular contractions Resting bradycardia is likely physiologic, noted at index visit and on heart monitor. Rare PACs and PVCs are present without significant symptoms or concerns. A nonsustained ventricular tachycardia was detected during monitoring. Discussed the potential impact of dietary changes on heart rhythm and the importance of maintaining hydration. - Continue current management with no immediate changes. -  Monitor for any new symptoms or changes in heart rhythm.  PRN f/u   Stanly Leavens, MD FASE  [facility] Cardiologist [facility]  [address] [place], [address] [place], [place] [address] [phone]  10:23 AM

## 2023-12-17 NOTE — Patient Instructions (Signed)
 Medication Instructions:  Your physician recommends that you continue on your current medications as directed. Please refer to the Current Medication list given to you today.  *If you need a refill on your cardiac medications before your next appointment, please call your pharmacy*  Lab Work: NONE If you have labs (blood work) drawn today and your tests are completely normal, you will receive your results only by: MyChart Message (if you have MyChart) OR A paper copy in the mail If you have any lab test that is abnormal or we need to change your treatment, we will call you to review the results.  Testing/Procedures: NONE  Follow-Up:As Needed  At [facility], you and your health needs are our priority.  As part of our continuing mission to provide you with exceptional heart care, our providers are all part of one team.  This team includes your primary Cardiologist (physician) and Advanced Practice Providers or APPs (Physician Assistants and Nurse Practitioners) who all work together to provide you with the care you need, when you need it.  Provider:   Stanly Leavens, MD

## 2023-12-29 ENCOUNTER — Ambulatory Visit: Admitting: Cardiovascular Disease

## 2024-01-02 ENCOUNTER — Other Ambulatory Visit (HOSPITAL_BASED_OUTPATIENT_CLINIC_OR_DEPARTMENT_OTHER): Payer: Self-pay

## 2024-01-05 ENCOUNTER — Other Ambulatory Visit (HOSPITAL_BASED_OUTPATIENT_CLINIC_OR_DEPARTMENT_OTHER): Payer: Self-pay

## 2024-01-05 ENCOUNTER — Other Ambulatory Visit (HOSPITAL_COMMUNITY): Payer: Self-pay

## 2024-01-06 ENCOUNTER — Other Ambulatory Visit (HOSPITAL_BASED_OUTPATIENT_CLINIC_OR_DEPARTMENT_OTHER): Payer: Self-pay

## 2024-02-02 ENCOUNTER — Other Ambulatory Visit (HOSPITAL_BASED_OUTPATIENT_CLINIC_OR_DEPARTMENT_OTHER): Payer: Self-pay

## 2024-02-24 ENCOUNTER — Other Ambulatory Visit (HOSPITAL_BASED_OUTPATIENT_CLINIC_OR_DEPARTMENT_OTHER): Payer: Self-pay

## 2024-03-10 ENCOUNTER — Other Ambulatory Visit (HOSPITAL_BASED_OUTPATIENT_CLINIC_OR_DEPARTMENT_OTHER): Payer: Self-pay

## 2024-03-10 MED ORDER — VORICONAZOLE 200 MG PO TABS
200.0000 mg | ORAL_TABLET | Freq: Two times a day (BID) | ORAL | 1 refills | Status: DC
  Filled 2024-03-10: qty 60, 30d supply, fill #0
  Filled 2024-03-11: qty 120, 60d supply, fill #0

## 2024-03-11 ENCOUNTER — Other Ambulatory Visit (HOSPITAL_BASED_OUTPATIENT_CLINIC_OR_DEPARTMENT_OTHER): Payer: Self-pay

## 2024-03-11 DIAGNOSIS — A6920 Lyme disease, unspecified: Secondary | ICD-10-CM | POA: Diagnosis not present

## 2024-03-11 DIAGNOSIS — B488 Other specified mycoses: Secondary | ICD-10-CM | POA: Diagnosis not present

## 2024-03-11 DIAGNOSIS — E617 Deficiency of multiple nutrient elements: Secondary | ICD-10-CM | POA: Diagnosis not present

## 2024-03-11 DIAGNOSIS — N959 Unspecified menopausal and perimenopausal disorder: Secondary | ICD-10-CM | POA: Diagnosis not present

## 2024-03-11 DIAGNOSIS — E538 Deficiency of other specified B group vitamins: Secondary | ICD-10-CM | POA: Diagnosis not present

## 2024-03-11 DIAGNOSIS — H5213 Myopia, bilateral: Secondary | ICD-10-CM | POA: Diagnosis not present

## 2024-03-11 DIAGNOSIS — R5383 Other fatigue: Secondary | ICD-10-CM | POA: Diagnosis not present

## 2024-03-11 DIAGNOSIS — Z8616 Personal history of COVID-19: Secondary | ICD-10-CM | POA: Diagnosis not present

## 2024-03-11 DIAGNOSIS — E559 Vitamin D deficiency, unspecified: Secondary | ICD-10-CM | POA: Diagnosis not present

## 2024-03-11 DIAGNOSIS — E039 Hypothyroidism, unspecified: Secondary | ICD-10-CM | POA: Diagnosis not present

## 2024-03-11 NOTE — Unmapped External Note (Signed)
 Assessment Note   Demographics Verification - Call Monitoring - Confidentiality      Member Verification: Member Verification    Call Monitoring Disclaimer: Call Monitoring Disclaimer         Person Providing Info on the Call   Who is the person providing the information on the call? Member      Limitations/Preferences   What, if any, physical limitations, health literacy, language needs and learning preferences do you have that I should be aware of when we talk?        Specify other language limitations        Specify other physical limitations        LCC Contact Type   Select the Health Your Way contact type: 4th or more contact-Telephonic Lifestyle Coaching           Lifestyle Coaching Healthy Eating Follow Up                    General Health Perception   How would you describe your health in general? My health is very good      SMART GOAL & SMART GOAL Follow Up     SMART GOAL Between now and next coaching call, I will maintain current healthy eating habits and work more intentionally on the wt-bearing exercises, with behavior-pairing like calf/toe raises while washing dishes (wash-raises).  Did the member set a SMART goal? Yes  Did the member meet their previous SMART goal?  Yes      Brief Summary of Member Status   What medical conditions has your doctor diagnosed you with?    No past medical history on file.   Did the member score via Care Engine for any condition(s) that he/she did NOT confirm?    HYW:  What medical conditions are you most concerned about and why?    In between MD visits people often find a need to go to the ER or an Urgent Care facilitycan you tell me if you had any concerns or issues in which you went to an ER or Urgent care in the last 12 months?      Condition Specific Metrics   Height/Weight/BMI Height: 5' 7.5/Weight: 140 lb/BMI (Calculated): 21.6   Blood Pressure    Outcome Metrics       Medications   Current Medications[1]    There are no discontinued medications.     Medication Review         Depression Screening PHQ2/PHQ9 Exclusions   PHQ2 EXCLUSION CRITERIA:  Do NOT administer the PHQ-2 if any of the following apply to this member.   PHQ9 EXCLUSION CRITERIA: To determine if this assessment is right for you, I need to ask you some questions before we start.  Besides depression, have you been diagnosed with any other mental health issues such as: No applicable exclusions              Depression PHQ2/PHQ9 Screening Results     PHQ2  The PHQ-2 questions are scored from 0 (not at all) to 3 (nearly every day) and then added together: Score 0-2 = Not likely to be at risk for depression, Score 3-6 = At risk for depression (further screening recommended). PHQ 2 Risk Score: 0 (03/11/2024 12:41 PM)       PHQ9 PHQ-9 Mini Module-If diagnosis of depression- show confirmed dx of depression and show PHQ-9 score & score description        Social Drivers of Health  Food Insecurity: No Food Insecurity (01/16/2022)   Received from [facility]   Hunger Vital Sign    Within the past 12 months, you worried that your food would run out before you got the money to buy more.: Never true    Within the past 12 months, the food you bought just didn't last and you didn't have money to get more.: Never true  Tobacco Use: Low Risk  (12/17/2023)   Received from [facility]   Patient History    Smoking Tobacco Use: Never    Smokeless Tobacco Use: Never      Lab Results   Results     There are no results available from this visit.         Immunizations   Immunizations Mini Module (show all Q/A pairs answered during this encounter)       Intervention/Actions   Education Topics Discussed      Referrals & Referral Follow Up          MEP/Mobile App Registration & Education on Tools/Resources   Is the member registered on the Member  Engagement Platform (MEP) or Mobile App?  Explain tools and resources available on Member Engagement Platform [facility]) and how to get the most benefit out of them.  Indicate resources reviewed with member: Yes-registered on [facility] 06/27/2023    Online resources (videos, recipes, etc.) Messaging with coach    Next Scheduled Appointment      Date Provider Department Visit Type   06/10/2024 12:30 PM Veronica Valle-Thau Care Management [facility] Coach Follow Up Assmt- 1st Attempt         Follow Up Needs Identified   Lifestyle Coaching Addendum: Account: [facility] Account Phone Number: [phone] Member Name: Ann Roberts Time Zone: PC[]  MT[]  CT[]  ET[x]  Call Number: 1 []  2 []  3 []  4+[x]   SDOH: n/a  PHQ2 due date: 09/09/24  Health Conditions:  has no past medical history on file.  Member Concerns (Focus/Agenda of Call): [] Weight Management  [x] Nutrition  [x] Exercise  [] Stress Management [] Sleep  [] Tobacco Cessation  [] General   Mbr. states: Rpts no more dizzy spells, and on a new med for sinus concerns.     Still eating a more animal-based diet, but has added fruits and some veggies.  Carb intake leads to more cravings.  Feels that she has some self-control to say yes or no to foods. Exercise doesn't come to mind as she is often busy.  Not sedentary in her work life, takes walks with her daughters.   None of the days are the same, so reminders are hard to use.  May try linking wt-bearing behaviors to washing dishes or other frequent chores.    Member's Why: To achieve lasting health, to be able to meet her childrens' needs today and in the future   Ht./Wt./BMI: 5' 7.5 140 lb Body mass index is 21.6 kg/m.  Motivation Level: 8  Briefly summarize the coaching/education you provided during this call:  Interventions addressed/Education Provided:  Used Motivational Interviewing to increase motivation/confidence Provided member with support and encouragement Reviewed  goals from last call Addressed barriers to change  Supported and promoted self-efficacy Reviewed mbr's status re: nutrition.  Discussed healthy changes in eating habits, and reasonable decision-making re: carbs and treats.  Reviewed that exercise is tricky for her as she is constantly moving, and doesn't think too much about doing other exercise.  Enc'd to pair frequent behaviors like dishwashing with small wt-bearing movement like calf raises and heel raises.  Repeated PHQ2 per timeline.  Smart Goal (required by 2nd call): Between now and next coaching call, I will maintain current healthy eating habits and work more intentionally on the wt-bearing exercises, with behavior-pairing like calf/toe raises while washing dishes (wash-raises).  Confidence Level: 7 (If confidence number is less than 8): Have to remember to do it Barriers: scatterbrained  Referred to Member Engagement Platform/Mobile App.   Plan for next call (Follow up on health gaps/barriers identified on this interaction, [facility] Focus to be discussed on next call): F/U MEP/MAH and/or Mobile app registration and messaging F/U w/ member on Smart Goal  Review: nutrition/goals Discuss: exercise           [1] No current outpatient medications on file.

## 2024-03-18 DIAGNOSIS — E559 Vitamin D deficiency, unspecified: Secondary | ICD-10-CM | POA: Diagnosis not present

## 2024-03-18 DIAGNOSIS — E039 Hypothyroidism, unspecified: Secondary | ICD-10-CM | POA: Diagnosis not present

## 2024-03-18 DIAGNOSIS — R5383 Other fatigue: Secondary | ICD-10-CM | POA: Diagnosis not present

## 2024-03-18 DIAGNOSIS — E538 Deficiency of other specified B group vitamins: Secondary | ICD-10-CM | POA: Diagnosis not present

## 2024-03-18 DIAGNOSIS — A6920 Lyme disease, unspecified: Secondary | ICD-10-CM | POA: Diagnosis not present

## 2024-03-18 DIAGNOSIS — E617 Deficiency of multiple nutrient elements: Secondary | ICD-10-CM | POA: Diagnosis not present

## 2024-03-18 DIAGNOSIS — N959 Unspecified menopausal and perimenopausal disorder: Secondary | ICD-10-CM | POA: Diagnosis not present

## 2024-03-18 DIAGNOSIS — Z8616 Personal history of COVID-19: Secondary | ICD-10-CM | POA: Diagnosis not present

## 2024-03-18 DIAGNOSIS — B488 Other specified mycoses: Secondary | ICD-10-CM | POA: Diagnosis not present

## 2024-03-26 ENCOUNTER — Other Ambulatory Visit (HOSPITAL_BASED_OUTPATIENT_CLINIC_OR_DEPARTMENT_OTHER): Payer: Self-pay

## 2024-03-31 DIAGNOSIS — R945 Abnormal results of liver function studies: Secondary | ICD-10-CM | POA: Diagnosis not present

## 2024-03-31 DIAGNOSIS — A6920 Lyme disease, unspecified: Secondary | ICD-10-CM | POA: Diagnosis not present

## 2024-03-31 DIAGNOSIS — N959 Unspecified menopausal and perimenopausal disorder: Secondary | ICD-10-CM | POA: Diagnosis not present

## 2024-03-31 DIAGNOSIS — U099 Post covid-19 condition, unspecified: Secondary | ICD-10-CM | POA: Diagnosis not present

## 2024-03-31 DIAGNOSIS — E559 Vitamin D deficiency, unspecified: Secondary | ICD-10-CM | POA: Diagnosis not present

## 2024-03-31 DIAGNOSIS — E039 Hypothyroidism, unspecified: Secondary | ICD-10-CM | POA: Diagnosis not present

## 2024-03-31 DIAGNOSIS — B488 Other specified mycoses: Secondary | ICD-10-CM | POA: Diagnosis not present

## 2024-03-31 DIAGNOSIS — E617 Deficiency of multiple nutrient elements: Secondary | ICD-10-CM | POA: Diagnosis not present

## 2024-03-31 DIAGNOSIS — E538 Deficiency of other specified B group vitamins: Secondary | ICD-10-CM | POA: Diagnosis not present

## 2024-03-31 DIAGNOSIS — R946 Abnormal results of thyroid function studies: Secondary | ICD-10-CM | POA: Diagnosis not present

## 2024-03-31 DIAGNOSIS — L659 Nonscarring hair loss, unspecified: Secondary | ICD-10-CM | POA: Diagnosis not present

## 2024-03-31 DIAGNOSIS — Z8616 Personal history of COVID-19: Secondary | ICD-10-CM | POA: Diagnosis not present

## 2024-03-31 DIAGNOSIS — R5383 Other fatigue: Secondary | ICD-10-CM | POA: Diagnosis not present

## 2024-04-28 ENCOUNTER — Other Ambulatory Visit (HOSPITAL_BASED_OUTPATIENT_CLINIC_OR_DEPARTMENT_OTHER): Payer: Self-pay

## 2024-05-11 ENCOUNTER — Other Ambulatory Visit (HOSPITAL_BASED_OUTPATIENT_CLINIC_OR_DEPARTMENT_OTHER): Payer: Self-pay

## 2024-05-18 ENCOUNTER — Ambulatory Visit (INDEPENDENT_AMBULATORY_CARE_PROVIDER_SITE_OTHER): Admitting: Allergy

## 2024-05-18 ENCOUNTER — Encounter: Payer: Self-pay | Admitting: Allergy

## 2024-05-18 ENCOUNTER — Other Ambulatory Visit (HOSPITAL_BASED_OUTPATIENT_CLINIC_OR_DEPARTMENT_OTHER): Payer: Self-pay

## 2024-05-18 VITALS — BP 110/60 | HR 84 | Resp 16 | Ht 67.5 in | Wt 149.2 lb

## 2024-05-18 DIAGNOSIS — J339 Nasal polyp, unspecified: Secondary | ICD-10-CM | POA: Diagnosis not present

## 2024-05-18 DIAGNOSIS — J338 Other polyp of sinus: Secondary | ICD-10-CM

## 2024-05-18 DIAGNOSIS — J3089 Other allergic rhinitis: Secondary | ICD-10-CM

## 2024-05-18 DIAGNOSIS — J302 Other seasonal allergic rhinitis: Secondary | ICD-10-CM

## 2024-05-18 DIAGNOSIS — Z886 Allergy status to analgesic agent status: Secondary | ICD-10-CM | POA: Diagnosis not present

## 2024-05-18 DIAGNOSIS — J45998 Other asthma: Secondary | ICD-10-CM | POA: Diagnosis not present

## 2024-05-18 MED ORDER — ARNUITY ELLIPTA 200 MCG/ACT IN AEPB
200.0000 ug | INHALATION_SPRAY | Freq: Every day | RESPIRATORY_TRACT | 5 refills | Status: AC
  Filled 2024-05-18 – 2024-05-24 (×2): qty 30, 30d supply, fill #0
  Filled 2024-06-25: qty 90, 90d supply, fill #1

## 2024-05-18 NOTE — Progress Notes (Signed)
 "   Follow-up Note  RE: Ann Roberts MRN: [id number] DOB: [birth year] Date of Office Visit: 05/18/2024   History of present illness: Ann Roberts is a 43 y.o. female presenting today for follow-up of nasal polyps/[name], allergic rhinitis.  She was last seen in the office on 12/31/22 by myself.  Discussed the use of AI scribe software for clinical note transcription with the patient, who gave verbal consent to proceed.  She has been experiencing persistent nasal congestion and sinus issues since last visit that has only worsened currently characterized by constant yellow mucus production and significant congestion, particularly at night, which disrupts her sleep. She has lack of taste and smell.  She uses a nasal saline device for relief but finds it only partially effective.  Currently on a 12-day course of oral steroids provided some relief however concerned that symptoms will worsen again once  completed.  She has a history of nasal polyps and adult-onset asthma. She suspects her polyps have returned. Previous sinus polyp surgery did not provide lasting relief.  She has not seen her ENT recently but plans to follow up to evaluate the status of her polyps. Her current medications include Arnuity, one puff daily, which effectively controls her asthma, and Albuterol /Airsupra  as a rescue inhaler, which she rarely needs. She tried Xhance spray without significant improvement in her congestion.  She has not gotten any benefit from nasal steroids.  She consulted an integrative medicine doctor and attempted treatment for fungal rhinosinusitis but experienced adverse reactions to itraconazole , had elevated liver function tests with voriconazole . Her most recent lab work in November showed 300 eosinophils.   Review of systems: 10pt ROS negative unless noted above in HPI  Past medical/social/surgical/family history have been reviewed and are unchanged unless specifically indicated below.  No  changes  Medication List: Current Outpatient Medications  Medication Sig Dispense Refill   albuterol  (VENTOLIN  HFA) 108 (90 Base) MCG/ACT inhaler Inhale 1 puff into the lungs every 4 (four) hours as needed.     ascorbic acid (VITAMIN C) 500 MG tablet Take 500 mg by mouth daily.     b complex vitamins capsule Take 1 capsule by mouth daily.     IVERMECTIN PO Take 20 mg by mouth every evening.     Multiple Vitamins-Minerals (MULTIVITAMIN WOMEN) TABS Take 1 tablet by mouth daily.     Multiple Vitamins-Minerals (ZINC  PO) Take 75 mg by mouth every evening. OTC Zinc  75mg  supplement.     Naltrexone HCl, Pain, 1.5 MG CAPS Take 1.5 mg by mouth every evening.     NATTOKINASE PO Take 400 mg by mouth daily. OTC Nattokinase 200mg  capsules     OVER THE COUNTER MEDICATION Take 2 capsules by mouth daily. OTC Thyroid  Support supplement.     progesterone  (PROMETRIUM ) 100 MG capsule Take 1 capsule (100 mg total) by mouth every evening. 90 capsule 1   Sodium Chloride -Xylitol (XLEAR SINUS CARE SPRAY NA) Place 1 spray into the nose daily.     Vitamin D-Vitamin K (VITAMIN K2-VITAMIN D3 PO) Take 1 tablet by mouth daily.     Fluticasone  Furoate (ARNUITY ELLIPTA ) 200 MCG/ACT AEPB Take 1 Inhalation by mouth in the morning and at bedtime. 30 each 5   No current facility-administered medications for this visit.     Known medication allergies: Allergies[1]   Physical examination: Blood pressure 110/60, pulse 84, resp. rate 16, height 5' 7.5 (1.715 m), weight 149 lb 3.2 oz (67.7 kg), SpO2 97%.  General: Alert, interactive, in  no acute distress. HEENT: PERRLA, TMs pearly gray, turbinates mildly edematous with thick discharge with large mucoid tissue resembling polyp b/l, post-pharynx non erythematous. Neck: Supple without lymphadenopathy. Lungs: Clear to auscultation without wheezing, rhonchi or rales. {no increased work of breathing. CV: Normal S1, S2 without murmurs. Abdomen: Nondistended, nontender. Skin: Warm  and dry, without lesions or rashes. Extremities:  No clubbing, cyanosis or edema. Neuro:   Grossly intact.  Diagnostics/Labs:  Spirometry: FEV1: 3.22L 101%, FVC: 4.27L 108%, ratio consistent with nonobstructive pattern  Assessment and plan: Nasal polyposis - currently with lack of smell/taste and worsening congestion Asthma -->[name] Allergic rhinitis   -Constellation of symptoms including polyp history with removal, increased sinus symptoms, wheeze/respiratory symptoms following NSAID administration is consistent with [name]/[name].  -Persistent sinus congestion with decreased smell/taste.  Symptoms currently improved to a degree with prednisone  course.   -Continue to avoid NSAID medications at this time. -Consider Tezspire therapy as means of controlling [name] (respiratory and mostly polyps).  This injectable is done every 4 weeks and can be self-administered at home.  Dupixent is also still an option as well.   - Environmental allergy  testing was positive to grass pollen, weed pollen, tree pollen, outdoor mold, cat, dog and horse.  Allergen avoidance measures provided. For allergy  symptom control if needed can take antihistamine like Zyrtec, Allegra or Xyzal.  For itchy/watery eyes symptoms can use Pataday as needed - Continue Arnuity 1 puff daily.  Rinse mouth after use.   During respiratory illnesses would increase to 1 puff twice a day for 1-2 weeks or until illness has resolved then resume 1 puff daily use.  - Albuterol  or Airsupra   2 puffs every 4 hours as needed for wheeze/cough/shortness of breath/chest tightness.  - Follow-up in ENT for polyp reassessment and let me know if you have a large polyp burden again and if so would recommend proceeding with Tezspire  Follow-up in 3-4 months or sooner if needed I appreciate the opportunity to take part in Soriya's care. Please do not hesitate to contact me with questions.  Sincerely,   Danita Brain,  MD Allergy /Immunology [facility]      [1]  Allergies Allergen Reactions   Motrin  [Ibuprofen ] Shortness Of Breath, Itching and Other (See Comments)    Nasal drainage   Nsaids Shortness Of Breath, Itching and Other (See Comments)    Nasal drainage   Sporanox  [Itraconazole ] Itching and Rash   "

## 2024-05-18 NOTE — Patient Instructions (Addendum)
-  Constellation of symptoms including polyp history with removal, increased sinus symptoms, wheeze/respiratory symptoms following NSAID administration is consistent with [name]/[name].  -Persistent sinus congestion with decreased smell/taste.  Symptoms currently improved to a degree with prednisone  course.   -Continue to avoid NSAID medications at this time. -Consider Tezspire therapy as means of controlling [name] (respiratory and mostly polyps).  This injectable is done every 4 weeks and can be self-administered at home.  Dupixent is also still an option as well.   - Environmental allergy  testing was positive to grass pollen, weed pollen, tree pollen, outdoor mold, cat, dog and horse.  Allergen avoidance measures provided. For allergy  symptom control if needed can take antihistamine like Zyrtec, Allegra or Xyzal.  For itchy/watery eyes symptoms can use Pataday as needed - Continue Arnuity 1 puff daily.  Rinse mouth after use.   During respiratory illnesses would increase to 1 puff twice a day for 1-2 weeks or until illness has resolved then resume 1 puff daily use.  - Albuterol  or Airsupra   2 puffs every 4 hours as needed for wheeze/cough/shortness of breath/chest tightness.  - Follow-up in ENT for polyp reassessment and let me know if you have a large polyp burden again and if so would recommend proceeding with Tezspire  Follow-up in 3-4 months or sooner if needed

## 2024-05-24 ENCOUNTER — Other Ambulatory Visit (HOSPITAL_BASED_OUTPATIENT_CLINIC_OR_DEPARTMENT_OTHER): Payer: Self-pay

## 2024-05-27 ENCOUNTER — Telehealth: Payer: Self-pay | Admitting: *Deleted

## 2024-05-27 ENCOUNTER — Ambulatory Visit (INDEPENDENT_AMBULATORY_CARE_PROVIDER_SITE_OTHER): Admitting: Otolaryngology

## 2024-05-27 VITALS — BP 100/65 | HR 70 | Ht 67.5 in | Wt 147.0 lb

## 2024-05-27 DIAGNOSIS — J338 Other polyp of sinus: Secondary | ICD-10-CM

## 2024-05-27 DIAGNOSIS — J339 Nasal polyp, unspecified: Secondary | ICD-10-CM

## 2024-05-27 DIAGNOSIS — J329 Chronic sinusitis, unspecified: Secondary | ICD-10-CM

## 2024-05-27 DIAGNOSIS — J324 Chronic pansinusitis: Secondary | ICD-10-CM

## 2024-05-27 NOTE — Progress Notes (Signed)
 Patient ID: [name], female   DOB: 07/11/1980, 44 y.o.   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  Over the past year, she has experienced persistent and severe sinonasal symptoms including nasal congestion, facial pressure, anosmia, and copious yellow nasal discharge, particularly in the mornings. She describes complete nasal obstruction at night and upon waking, with inability to breathe through her nose. She has had significant impairment of taste and smell for the past year, with frequent episodes of obstruction, especially when lying down at night.  She completed a twelve-day course of oral corticosteroids beginning May 10, 2024, with a tapering regimen. She reports significant improvement in symptoms following steroids, including restoration of nasal breathing and resolution of obstruction, although improvement began after eight to nine days. Prior to steroids, she had not had taste or smell for a year and described her symptoms as severe. She is currently feeling well but expresses concern about the temporary nature of steroid effects.  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 Despite these interventions, she continued to experience chronic symptoms and recurrent polyps.  Allergy  and immunology have recommended consideration of biologic therapy, specifically Tezspire , for long-term control. She expresses apprehension about potential adverse effects of biologics but is considering this option due to the severity and  chronicity of her symptoms. She occasionally uses nasal rinses for symptomatic relief, but was unable to pursue medicated rinses due to cost.  She has adult-onset asthma and NSAID sensitivity consistent with [name], and is unable to take NSAIDs due to adverse reactions. She associates the onset of her current conditions with a prior COVID infection and is currently under the care of an integrative medicine physician in addition to allergy  and immunology.  Exam: General: Communicates without difficulty, well nourished, no acute distress. Head: Normocephalic, no evidence injury, no tenderness, facial buttresses intact without stepoff. Face/sinus: No tenderness to palpation and percussion. Facial movement is normal and symmetric. Eyes: PERRL, EOMI. No scleral icterus, conjunctivae clear. Neuro: CN II exam reveals vision grossly intact.  No nystagmus at any point of gaze. Ears: Auricles well formed without lesions.  Ear canals are intact without mass or lesion.  No erythema or edema is appreciated.  The TMs are intact without fluid. Nose: External evaluation reveals normal support and skin without lesions.  Dorsum is intact.  Anterior rhinoscopy reveals congested mucosa over anterior aspect of inferior turbinates and intact septum.  Recurrent polypoid tissue is noted bilaterally.  Oral:  Oral cavity and oropharynx are intact, symmetric, without erythema or edema.  Mucosa is moist without lesions. Neck: Full range of motion without pain.  There is no significant lymphadenopathy.  No masses palpable.  Thyroid  bed within normal limits to palpation.  Parotid glands and submandibular glands equal bilaterally without mass.  Trachea is midline. Neuro:  CN 2-12 grossly intact.    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 She experiences severe, recurrent symptoms with significant impact on quality of life. Current examination  reveals minimal polyp burden, likely secondary to recent corticosteroid use. No evidence of active infection. Given the chronicity, recurrence, and limited long-term control options, biologic therapy is indicated. Biologics such as Tezspire  offer high efficacy for long-term control, reducing polyp recurrence and improving sinonasal symptoms. Discussed potential adverse effects, including rare risks of malignancy and ocular complications, but emphasized favorable safety and efficacy profile based on current data. - Initiated biologic therapy (Tezspire ) for long-term control, per allergy /immunology recommendations. - Advised follow-up in six months for reassessment, or earlier if new infections or worsening symptoms develop.

## 2024-05-27 NOTE — Telephone Encounter (Signed)
 Saw ENT and they agreed with you about starting Tezspire . Please advise if you want Tammy to proceed with that process.

## 2024-05-31 ENCOUNTER — Encounter: Payer: Self-pay | Admitting: Allergy

## 2024-06-02 ENCOUNTER — Other Ambulatory Visit (HOSPITAL_COMMUNITY): Payer: Self-pay

## 2024-06-02 ENCOUNTER — Telehealth: Payer: Self-pay | Admitting: *Deleted

## 2024-06-02 MED ORDER — TEZSPIRE 210 MG/1.91ML ~~LOC~~ SOAJ
210.0000 mg | SUBCUTANEOUS | 11 refills | Status: DC
  Filled 2024-06-03: qty 1.91, fill #0

## 2024-06-02 NOTE — Telephone Encounter (Signed)
 Called patient and advised approval, copay card and submit to [facility] for Tezspire  with initial injection in clinic then to home admin. Will reach out once delivery set to make appt to start therapy

## 2024-06-03 ENCOUNTER — Encounter: Payer: Self-pay | Admitting: Pharmacist

## 2024-06-03 ENCOUNTER — Other Ambulatory Visit (HOSPITAL_COMMUNITY): Payer: Self-pay

## 2024-06-03 ENCOUNTER — Ambulatory Visit: Attending: Internal Medicine | Admitting: Pharmacist

## 2024-06-03 ENCOUNTER — Other Ambulatory Visit: Payer: Self-pay

## 2024-06-03 ENCOUNTER — Other Ambulatory Visit: Payer: Self-pay | Admitting: Pharmacist

## 2024-06-03 DIAGNOSIS — Z7189 Other specified counseling: Secondary | ICD-10-CM

## 2024-06-03 MED ORDER — TEZSPIRE 210 MG/1.91ML ~~LOC~~ SOAJ
210.0000 mg | SUBCUTANEOUS | 11 refills | Status: AC
  Filled 2024-06-04: qty 1.91, 28d supply, fill #0

## 2024-06-03 NOTE — Progress Notes (Signed)
 "  HPI Patient presents today to [facility] clinic for specialty medication review. She is currently prescribed Tezspire  for pansinusitis, nasal polyps, [name]. Dr. Jeneal is her specialist.  Adherence: has not yet started   Efficacy: has not yet started   Dosing: 210 mg subcutaneous once every 4 weeks   OBJECTIVE Allergies[1]  Outpatient Encounter Medications as of 06/03/2024  Medication Sig   albuterol  (VENTOLIN  HFA) 108 (90 Base) MCG/ACT inhaler Inhale 1 puff into the lungs every 4 (four) hours as needed.   ascorbic acid (VITAMIN C) 500 MG tablet Take 500 mg by mouth daily.   b complex vitamins capsule Take 1 capsule by mouth daily.   Fluticasone  Furoate (ARNUITY ELLIPTA ) 200 MCG/ACT AEPB Inhale 1 puff (200 mcg) into the lungs daily.  May increase to 1 puff twice daily during respiratory illness.   IVERMECTIN PO Take 20 mg by mouth every evening.   Multiple Vitamins-Minerals (MULTIVITAMIN WOMEN) TABS Take 1 tablet by mouth daily.   Multiple Vitamins-Minerals (ZINC  PO) Take 75 mg by mouth every evening. OTC Zinc  75mg  supplement.   Naltrexone HCl, Pain, 1.5 MG CAPS Take 1.5 mg by mouth every evening.   NATTOKINASE PO Take 400 mg by mouth daily. OTC Nattokinase 200mg  capsules   OVER THE COUNTER MEDICATION Take 2 capsules by mouth daily. OTC Thyroid  Support supplement.   progesterone  (PROMETRIUM ) 100 MG capsule Take 1 capsule (100 mg total) by mouth every evening.   Sodium Chloride -Xylitol (XLEAR SINUS CARE SPRAY NA) Place 1 spray into the nose daily.   Tezepelumab -ekko (TEZSPIRE ) 210 MG/1. SOAJ Inject 210 mg into the skin every 28 (twenty-eight) days.   Vitamin D-Vitamin K (VITAMIN K2-VITAMIN D3 PO) Take 1 tablet by mouth daily.   [DISCONTINUED] Tezepelumab -ekko (TEZSPIRE ) 210 MG/1. SOAJ Inject 210 mg into the skin every 28 (twenty-eight) days.   No facility-administered encounter medications on file as of 06/03/2024.     PFTs    Latest Ref Rng & Units 07/12/2022    2:45 PM  PFT  Results  FVC-Pre L 4.44   FVC-Predicted Pre % 106   FVC-Post L 4.37   FVC-Predicted Post % 105   Pre FEV1/FVC % % 77   Post FEV1/FCV % % 80   FEV1-Pre L 3.43   FEV1-Predicted Pre % 102   FEV1-Post L 3.50   DLCO uncorrected ml/min/mmHg 28.30   DLCO UNC% % 114   DLCO corrected ml/min/mmHg 27.72   DLCO COR %Predicted % 112   DLVA Predicted % 106   TLC L 7.08   TLC % Predicted % 125   RV % Predicted % 143      Assessment   Biologics training for tezepulumab (Tezspire )  Goals of therapy: Mechanism: human monoclonal IgG2? antibody that binds to TSLP. This blocks TSLP from its effect on inflammation including reduce eosinophils, IgE, FeNO, IL-5, and IL-13. Mechanism is not definitively established. Reviewed that Tezspire  is add-on medication and patient must continue maintenance inhaler regimen. Response to therapy: may take 3-4 months to determine efficacy.  Side effects: injection site reaction (6-18%), antibody development (2%), arthralgia (4%), back pain (4%), pharyngitis (4%)  Dose: Tezspire  210 mg once every 4 weeks  Administration/Storage:  Reviewed administration sites of thigh or abdomen (at least 2-3 inches away from abdomen). Reviewed the upper arm is only appropriate if caregiver is administering injection  Do not shake pen/syringe as this could lead to product foaming or precipitation. Do not shake syringe as this could lead to product foaming or precipitation.  Access: Approval of Tezspire  through: insurance  Medication Reconciliation  A drug regimen assessment was performed, including review of allergies, interactions, disease-state management, dosing and immunization history. Medications were reviewed with the patient, including name, instructions, indication, goals of therapy, potential side effects, importance of adherence, and safe use.  Drug interaction(s): none noted  PLAN Start Tezspire  210mg  SQ every 28 days.  Rx sent to: [facility]:  [phone] .    All questions encouraged and answered.  Instructed patient to reach out with any further questions or concerns.  Thank you for allowing pharmacy to participate in this patient's care.  This appointment required 15 minutes of patient care (this includes precharting, chart review, review of results, face-to-face care, etc.).  Herlene Fleeta Morris, PharmD, JAQUELINE, CPP Clinical Pharmacist [facility] [phone]      [1]  Allergies Allergen Reactions   Aspirin Shortness Of Breath   Motrin  [Ibuprofen ] Shortness Of Breath, Itching and Other (See Comments)    Nasal drainage   Nsaids Shortness Of Breath, Itching and Other (See Comments)    Nasal drainage   Sporanox  [Itraconazole ] Itching and Rash   "

## 2024-06-03 NOTE — Telephone Encounter (Signed)
 Approved and patient contacted regarding same and submit to [facility]

## 2024-06-03 NOTE — Progress Notes (Signed)
 Pharmacy Patient Advocate Encounter  Insurance verification completed.   The patient is insured through [facility]   Ran test claim for Tezspire . Co-pay is $0.  This test claim was processed through [facility]- copay amounts may vary at other pharmacies due to pharmacy/plan contracts, or as the patient moves through the different stages of their insurance plan.

## 2024-06-04 ENCOUNTER — Other Ambulatory Visit: Payer: Self-pay

## 2024-06-04 NOTE — Progress Notes (Signed)
 Specialty Pharmacy Initial Fill Coordination Note  Ann Roberts is a 44 y.o. female contacted today regarding initial fill of specialty medication(s) Tezepelumab -ekko (Tezspire )   Patient requested Courier to Provider Office   Delivery date: 06/08/24   Verified address: [facility] at [facility] [place]   Medication will be filled on: 06/07/24   Patient is aware of $0 copayment. Patient has copay card

## 2024-06-08 ENCOUNTER — Ambulatory Visit (INDEPENDENT_AMBULATORY_CARE_PROVIDER_SITE_OTHER)

## 2024-06-08 DIAGNOSIS — J4550 Severe persistent asthma, uncomplicated: Secondary | ICD-10-CM

## 2024-06-08 NOTE — Progress Notes (Signed)
"                                                                        Immunotherapy   Patient Details  Name: Ann Roberts MRN: [id number] Date of Birth: 1980/10/05  06/08/2024  [name] started Tezspire  injections today. Teaching was provided as patient will be doing home administration.  Frequency: Every 4 weeks Patient instructions given. Patient waited in office for 15 minutes without any issues.    Jocee Kissick 06/08/2024, 4:12 PM   "

## 2024-06-25 ENCOUNTER — Other Ambulatory Visit: Payer: Self-pay

## 2024-07-06 ENCOUNTER — Ambulatory Visit

## 2024-11-26 ENCOUNTER — Ambulatory Visit (INDEPENDENT_AMBULATORY_CARE_PROVIDER_SITE_OTHER): Admitting: Otolaryngology
